# Patient Record
Sex: Female | Born: 1995
Health system: Southern US, Community
[De-identification: ages and names within clinical notes are randomized; demographics above are authoritative.]

## PROBLEM LIST (undated history)

## (undated) DIAGNOSIS — F32A Depression, unspecified: Secondary | ICD-10-CM

## (undated) DIAGNOSIS — F329 Major depressive disorder, single episode, unspecified: Secondary | ICD-10-CM

## (undated) DIAGNOSIS — J45909 Unspecified asthma, uncomplicated: Secondary | ICD-10-CM

## (undated) HISTORY — PX: TONSILLECTOMY AND ADENOIDECTOMY: SUR1326

## (undated) HISTORY — DX: Unspecified asthma, uncomplicated: J45.909

---
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
## 2014-01-20 ENCOUNTER — Encounter (HOSPITAL_COMMUNITY): Payer: Self-pay | Admitting: Psychiatry

## 2014-01-20 ENCOUNTER — Ambulatory Visit (INDEPENDENT_AMBULATORY_CARE_PROVIDER_SITE_OTHER): Payer: Private Health Insurance - Indemnity | Admitting: Psychiatry

## 2014-01-20 VITALS — BP 122/77 | HR 77 | Ht 66.0 in | Wt 112.4 lb

## 2014-01-20 DIAGNOSIS — F39 Unspecified mood [affective] disorder: Secondary | ICD-10-CM

## 2014-01-20 DIAGNOSIS — F1210 Cannabis abuse, uncomplicated: Secondary | ICD-10-CM

## 2014-01-20 DIAGNOSIS — F411 Generalized anxiety disorder: Secondary | ICD-10-CM

## 2014-01-20 MED ORDER — BUSPIRONE HCL 10 MG PO TABS: 10.0000 mg | ORAL_TABLET | Freq: Three times a day (TID) | ORAL | Status: DC

## 2014-01-20 MED ORDER — SERTRALINE HCL 100 MG PO TABS: 150.0000 mg | ORAL_TABLET | Freq: Every day | ORAL | Status: DC

## 2014-01-20 MED ORDER — ARIPIPRAZOLE 5 MG PO TABS: 5.0000 mg | ORAL_TABLET | Freq: Two times a day (BID) | ORAL | Status: DC

## 2014-01-20 NOTE — Progress Notes (Addendum)
   [facility]  Paula Whitaker 02/02/1996  Date:  01/20/14 Subjective: Pt is here for follow up Sleeping is fair; appetite is fair. Mood is anxious, with panic attacks. She doesn't like the hydroxyzine, has tried 25 mg, and 50 mg and she says it doesn't help. Depression-7/10, Anxiety 8/10. She also says when she has anxiety, she cracks her knuckles a certain way. Tolerating the other medications. She is on sertraline 100 mg po, and abilify 10 mg daily, and has some drowsiness with the abilify, will split up the dose. Rtc in 4 weeks.   There were no vitals filed for this visit.  Mental Status Examination  Appearance: casual  Alert: Yes Attention: fair  Cooperative: Yes Eye Contact: Fair Speech: wdl  Psychomotor Activity: Normal Memory/Concentration: fair  Oriented: time/date, situation and day of week Mood: Anxious and Dysphoric Affect: Constricted Thought Processes and Associations: Linear and Logical Fund of Knowledge: Fair Thought Content: preoccupations Insight: Fair Judgement: Fair  Diagnosis:  Episodic Mood Cannabis Abuse GAD Treatment Plan:  Abilify 5 mg, 2 times daily for mood  Sertraline 150 mg po daily for depression Buspar 10 mg tid for anxiety  Kendrick FriesBLANKMANN, Paula Wareing, NP

## 2014-02-23 ENCOUNTER — Encounter (HOSPITAL_COMMUNITY): Payer: Self-pay | Admitting: Psychiatry

## 2014-02-23 ENCOUNTER — Ambulatory Visit (INDEPENDENT_AMBULATORY_CARE_PROVIDER_SITE_OTHER): Payer: Private Health Insurance - Indemnity | Admitting: Psychiatry

## 2014-02-23 VITALS — BP 137/72 | HR 89 | Ht 66.0 in | Wt 115.4 lb

## 2014-02-23 DIAGNOSIS — F411 Generalized anxiety disorder: Secondary | ICD-10-CM

## 2014-02-23 DIAGNOSIS — F39 Unspecified mood [affective] disorder: Secondary | ICD-10-CM

## 2014-02-23 MED ORDER — PROPRANOLOL HCL 10 MG PO TABS: 10.0000 mg | ORAL_TABLET | Freq: Two times a day (BID) | ORAL | Status: DC | PRN

## 2014-02-23 MED ORDER — BUSPIRONE HCL 15 MG PO TABS: 15.0000 mg | ORAL_TABLET | Freq: Three times a day (TID) | ORAL | Status: DC

## 2014-02-23 MED ORDER — ARIPIPRAZOLE 5 MG PO TABS: 5.0000 mg | ORAL_TABLET | Freq: Two times a day (BID) | ORAL | Status: DC

## 2014-02-23 MED ORDER — SERTRALINE HCL 100 MG PO TABS: 200.0000 mg | ORAL_TABLET | Freq: Every day | ORAL | Status: DC

## 2014-02-23 NOTE — Progress Notes (Addendum)
   [facility]  Paula Whitaker 06/05/1996  Date:  02/23/14 Subjective:  Pt is working, and will start school up again. sleeping and eating normally. Anxiety 7/10, Depression 4/10. Tolerating her medications. Constricted affect, and mildly irritable.Well groomed. She reports she is doing better, but still having intermittent anxiety. She denies SI/HI/AVH. She hasn't been going to therapy, because of her rigorous work schedule. Discussed alternatives to coping with stress and anxiety. Rtc in 4 weeks.   Filed Vitals:   02/23/14 1629  BP: 137/72  Pulse: 89    Mental Status Examination  Appearance: casual  Alert: Yes Attention: fair  Cooperative: Yes Eye Contact: Fair Speech: wdl  Psychomotor Activity: Normal Memory/Concentration: fair  Oriented: place and day of week Mood: Anxious Affect: Constricted Thought Processes and Associations: Linear Fund of Knowledge: Fair Thought Content: preoccupations Insight: Fair Judgement: Fair  Diagnosis:  GAD Episodic Mood Treatment Plan:  Rtc in 4 weeks Abilify 5 mg po daily for mood Sertraline 200 mg daily for anxiety/depression Buspar 15 mg tid for anxiety Propranolol 10 mg bid prn for situational anxiety   Kendrick FriesBLANKMANN, [place]ise Wahler, NP

## 2014-03-30 ENCOUNTER — Ambulatory Visit (HOSPITAL_COMMUNITY): Payer: Self-pay | Admitting: Psychiatry

## 2014-07-08 ENCOUNTER — Emergency Department (HOSPITAL_COMMUNITY)
Admission: EM | Admit: 2014-07-08 | Discharge: 2014-07-09 | Disposition: A | Discharge status: Other Acute Inpatient Hospital | Payer: Private Health Insurance - Indemnity | Attending: Emergency Medicine | Admitting: Emergency Medicine

## 2014-07-08 DIAGNOSIS — F1310 Sedative, hypnotic or anxiolytic abuse, uncomplicated: Secondary | ICD-10-CM | POA: Insufficient documentation

## 2014-07-08 DIAGNOSIS — Z3202 Encounter for pregnancy test, result negative: Secondary | ICD-10-CM | POA: Diagnosis not present

## 2014-07-08 DIAGNOSIS — Z79899 Other long term (current) drug therapy: Secondary | ICD-10-CM | POA: Diagnosis not present

## 2014-07-08 DIAGNOSIS — F329 Major depressive disorder, single episode, unspecified: Secondary | ICD-10-CM | POA: Diagnosis not present

## 2014-07-08 DIAGNOSIS — F39 Unspecified mood [affective] disorder: Secondary | ICD-10-CM | POA: Diagnosis not present

## 2014-07-08 DIAGNOSIS — R45851 Suicidal ideations: Secondary | ICD-10-CM

## 2014-07-08 DIAGNOSIS — F1410 Cocaine abuse, uncomplicated: Secondary | ICD-10-CM | POA: Insufficient documentation

## 2014-07-08 DIAGNOSIS — Z046 Encounter for general psychiatric examination, requested by authority: Secondary | ICD-10-CM | POA: Diagnosis present

## 2014-07-08 DIAGNOSIS — F1210 Cannabis abuse, uncomplicated: Secondary | ICD-10-CM

## 2014-07-08 DIAGNOSIS — J45909 Unspecified asthma, uncomplicated: Secondary | ICD-10-CM | POA: Diagnosis not present

## 2014-07-08 DIAGNOSIS — F411 Generalized anxiety disorder: Secondary | ICD-10-CM | POA: Insufficient documentation

## 2014-07-08 NOTE — ED Provider Notes (Signed)
CSN: [id number]     Arrival date & time 07/08/14  2332 History  This chart was scribed for Elpidio AnisShari Renleigh Ouellet, PA-C with Derwood KaplanAnkit Nanavati, MD by Tonye RoyaltyJoshua Chen, ED Scribe. This patient was seen in room WTR4/WLPT4 and the patient's care was started at 11:43 PM.    No chief complaint on file.  The history is provided by the patient. No language interpreter was used.    HPI Comments: Paula Whitaker is a 18 y.o. female who presents to the Emergency Department complaining of suicidal ideation with onset 1 week ago. She reports associated decreased appetite. She states she does not use anxiety or depression medications normally. She denies smoking.  She denies vomiting, fever, abdominal pain, or chest pain.  Past Medical History  Diagnosis Date  . Asthma   . Depression    Past Surgical History  Procedure Laterality Date  . Tonsillectomy and adenoidectomy     Family History  Problem Relation Age of Onset  . Schizophrenia      Patenral Grandmother   History  Substance Use Topics  . Smoking status: Never Smoker   . Smokeless tobacco: Never Used  . Alcohol Use: No   OB History    No data available     Review of Systems  Constitutional: Negative for fever.  Cardiovascular: Negative for chest pain.  Gastrointestinal: Negative for vomiting and abdominal pain.  Psychiatric/Behavioral: Positive for suicidal ideas.      Allergies  Review of patient's allergies indicates no known allergies.  Home Medications   Prior to Admission medications   Medication Sig Start Date End Date Taking? Authorizing Provider  albuterol (PROAIR HFA) 108 (90 BASE) MCG/ACT inhaler 2 puffs every 6 (six) hours as needed for wheezing or shortness of breath. 09/15/13   Chauncey MannGlenn E Jennings, MD  ARIPiprazole (ABILIFY) 5 MG tablet Take 1 tablet (5 mg total) by mouth 2 (two) times daily. 02/23/14 02/23/15  Kendrick FriesMeghan Blankmann, NP  aspirin-acetaminophen-caffeine (EXCEDRIN MIGRAINE) [phone] MG per tablet Take 1 tablet by mouth  every 6 (six) hours as needed for headache or migraine. 09/15/13   Chauncey MannGlenn E Jennings, MD  busPIRone (BUSPAR) 15 MG tablet Take 1 tablet (15 mg total) by mouth 3 (three) times daily. 02/23/14   Kendrick FriesMeghan Blankmann, NP  fluticasone (FLONASE) 50 MCG/ACT nasal spray Place 1 spray into the nose daily as needed for allergies.  11/18/13   Historical Provider, MD  hydroxypropyl methylcellulose (ISOPTO TEARS) 2.5 % ophthalmic solution Place 1 drop into both eyes 3 (three) times daily as needed for dry eyes.    Historical Provider, MD  Multiple Vitamin (MULTIVITAMIN WITH MINERALS) TABS tablet Take 1 tablet by mouth daily. 09/15/13   Chauncey MannGlenn E Jennings, MD  Norethin Ace-Eth Estrad-FE (MINASTRIN 24 FE) 1-20 MG-MCG(24) CHEW Chew 1 tablet by mouth daily. 09/15/13   Chauncey MannGlenn E Jennings, MD  propranolol (INDERAL) 10 MG tablet Take 1 tablet (10 mg total) by mouth 2 (two) times daily as needed (anxiety). 02/23/14   Kendrick FriesMeghan Blankmann, NP  sertraline (ZOLOFT) 100 MG tablet Take 2 tablets (200 mg total) by mouth daily. 02/23/14   Kendrick FriesMeghan Blankmann, NP   There were no vitals taken for this visit. Physical Exam  Constitutional: She is oriented to person, place, and time. She appears well-developed and well-nourished.  HENT:  Head: Normocephalic.  Neck: Normal range of motion. Neck supple.  Cardiovascular: Normal rate and regular rhythm.   Pulmonary/Chest: Effort normal and breath sounds normal.  Abdominal: Soft. Bowel sounds are normal. There is no  tenderness. There is no rebound and no guarding.  Musculoskeletal: Normal range of motion.  Neurological: She is alert and oriented to person, place, and time.  Skin: Skin is warm and dry. No rash noted.  Psychiatric: Her speech is normal. She exhibits a depressed mood.  Patient is tearful, sobbing. She does not endorse or deny SI.    ED Course  Procedures (including critical care time)   COORDINATION OF CARE: 11:45 PM Discussed treatment plan with patient at beside, the patient agrees  with the plan and has no further questions at this time.   Labs Review Labs Reviewed - No data to display  Imaging Review No results found.   EKG Interpretation None      MDM   Final diagnoses:  None    1. Depression  TTS consult required to determine disposition. Patient agreeable. Medically cleared.  I personally performed the services described in this documentation, which was scribed in my presence. The recorded information has been reviewed and is accurate.     Arnoldo HookerShari A Christi Wirick, PA-C 07/10/14 [id number]  Derwood KaplanAnkit Nanavati, MD 07/10/14 [phone]

## 2014-07-09 ENCOUNTER — Inpatient Hospital Stay (HOSPITAL_COMMUNITY)
Admission: AD | Admit: 2014-07-09 | Discharge: 2014-07-12 | DRG: 881 | Disposition: A | Discharge status: Home / Self Care | Payer: Private Health Insurance - Indemnity | Source: Intra-hospital | Attending: Psychiatry | Admitting: Psychiatry

## 2014-07-09 ENCOUNTER — Encounter (HOSPITAL_COMMUNITY): Payer: Self-pay | Admitting: *Deleted

## 2014-07-09 DIAGNOSIS — J45909 Unspecified asthma, uncomplicated: Secondary | ICD-10-CM | POA: Diagnosis present

## 2014-07-09 DIAGNOSIS — F329 Major depressive disorder, single episode, unspecified: Secondary | ICD-10-CM | POA: Diagnosis present

## 2014-07-09 DIAGNOSIS — F39 Unspecified mood [affective] disorder: Secondary | ICD-10-CM | POA: Diagnosis present

## 2014-07-09 DIAGNOSIS — Z818 Family history of other mental and behavioral disorders: Secondary | ICD-10-CM | POA: Diagnosis not present

## 2014-07-09 DIAGNOSIS — F1410 Cocaine abuse, uncomplicated: Secondary | ICD-10-CM | POA: Diagnosis present

## 2014-07-09 DIAGNOSIS — G4700 Insomnia, unspecified: Secondary | ICD-10-CM | POA: Diagnosis present

## 2014-07-09 DIAGNOSIS — F321 Major depressive disorder, single episode, moderate: Secondary | ICD-10-CM | POA: Insufficient documentation

## 2014-07-09 DIAGNOSIS — Z23 Encounter for immunization: Secondary | ICD-10-CM | POA: Diagnosis not present

## 2014-07-09 DIAGNOSIS — F410 Panic disorder [episodic paroxysmal anxiety] without agoraphobia: Secondary | ICD-10-CM | POA: Diagnosis present

## 2014-07-09 DIAGNOSIS — F1210 Cannabis abuse, uncomplicated: Secondary | ICD-10-CM | POA: Diagnosis present

## 2014-07-09 DIAGNOSIS — F1919 Other psychoactive substance abuse with unspecified psychoactive substance-induced disorder: Secondary | ICD-10-CM

## 2014-07-09 DIAGNOSIS — F411 Generalized anxiety disorder: Secondary | ICD-10-CM | POA: Diagnosis present

## 2014-07-09 DIAGNOSIS — R45851 Suicidal ideations: Secondary | ICD-10-CM

## 2014-07-09 LAB — COMPREHENSIVE METABOLIC PANEL
ALT: 11 U/L (ref 0–35)
AST: 19 U/L (ref 0–37)
Albumin: 4.2 g/dL (ref 3.5–5.2)
Alkaline Phosphatase: 80 U/L (ref 39–117)
Anion gap: 13 (ref 5–15)
BILIRUBIN TOTAL: 1.3 mg/dL — AB (ref 0.3–1.2)
BUN: 10 mg/dL (ref 6–23)
CALCIUM: 10.4 mg/dL (ref 8.4–10.5)
CO2: 27 meq/L (ref 19–32)
CREATININE: 0.79 mg/dL (ref 0.50–1.10)
Chloride: 99 mEq/L (ref 96–112)
GFR calc Af Amer: >90 mL/min (ref >90)
GLUCOSE: 94 mg/dL (ref 70–99)
Potassium: 3.6 mEq/L — ABNORMAL LOW (ref 3.7–5.3)
Sodium: 139 mEq/L (ref 137–147)
Total Protein: 7.7 g/dL (ref 6.0–8.3)

## 2014-07-09 LAB — CBC WITH DIFFERENTIAL/PLATELET
BASOS ABS: 0.1 10*3/uL (ref 0.0–0.1)
Basophils Relative: 1 % (ref 0–1)
EOS ABS: 1 10*3/uL — AB (ref 0.0–0.7)
Eosinophils Relative: 9 % — ABNORMAL HIGH (ref 0–5)
HCT: 43.3 % (ref 36.0–46.0)
Hemoglobin: 14.9 g/dL (ref 12.0–15.0)
Lymphocytes Relative: 25 % (ref 12–46)
Lymphs Abs: 2.8 10*3/uL (ref 0.7–4.0)
MCH: 30.2 pg (ref 26.0–34.0)
MCHC: 34.4 g/dL (ref 30.0–36.0)
MCV: 87.8 fL (ref 78.0–100.0)
Monocytes Absolute: 1 10*3/uL (ref 0.1–1.0)
Monocytes Relative: 9 % (ref 3–12)
NEUTROS ABS: 6.5 10*3/uL (ref 1.7–7.7)
NEUTROS PCT: 56 % (ref 43–77)
Platelets: 307 10*3/uL (ref 150–400)
RBC: 4.93 MIL/uL (ref 3.87–5.11)
RDW: 12.8 % (ref 11.5–15.5)
WBC: 11.4 10*3/uL — ABNORMAL HIGH (ref 4.0–10.5)

## 2014-07-09 LAB — SALICYLATE LEVEL

## 2014-07-09 LAB — RAPID URINE DRUG SCREEN, HOSP PERFORMED
Amphetamines: NOT DETECTED
Barbiturates: NOT DETECTED
Benzodiazepines: POSITIVE — AB
Cocaine: POSITIVE — AB
Opiates: NOT DETECTED
Tetrahydrocannabinol: POSITIVE — AB

## 2014-07-09 LAB — PREGNANCY, URINE: PREG TEST UR: NEGATIVE

## 2014-07-09 LAB — ETHANOL

## 2014-07-09 LAB — ACETAMINOPHEN LEVEL: Acetaminophen (Tylenol), Serum: <15 ug/mL (ref 10–30)

## 2014-07-09 MED ORDER — MAGNESIUM HYDROXIDE 400 MG/5ML PO SUSP: 30.0000 mL | Freq: Every day | ORAL | Status: DC | PRN

## 2014-07-09 MED ORDER — INFLUENZA VAC SPLIT QUAD 0.5 ML IM SUSY
0.5000 mL | PREFILLED_SYRINGE | INTRAMUSCULAR | Status: AC
  Administered 2014-07-10: 0.5 mL via INTRAMUSCULAR
  Filled 2014-07-09: qty 0.5

## 2014-07-09 MED ORDER — TRAZODONE HCL 50 MG PO TABS
50.0000 mg | ORAL_TABLET | Freq: Every evening | ORAL | Status: DC | PRN
  Administered 2014-07-09 – 2014-07-11 (×3): 50 mg via ORAL
  Filled 2014-07-09 (×2): qty 1
  Filled 2014-07-09: qty 10
  Filled 2014-07-09: qty 1

## 2014-07-09 MED ORDER — BUSPIRONE HCL 15 MG PO TABS
15.0000 mg | ORAL_TABLET | Freq: Two times a day (BID) | ORAL | Status: DC
  Filled 2014-07-09 (×5): qty 1

## 2014-07-09 MED ORDER — LORAZEPAM 1 MG PO TABS: 1.0000 mg | ORAL_TABLET | Freq: Three times a day (TID) | ORAL | Status: DC | PRN

## 2014-07-09 MED ORDER — PROPRANOLOL HCL 10 MG PO TABS
10.0000 mg | ORAL_TABLET | Freq: Two times a day (BID) | ORAL | Status: DC | PRN
  Filled 2014-07-09: qty 20

## 2014-07-09 MED ORDER — HYDROXYZINE HCL 25 MG PO TABS
25.0000 mg | ORAL_TABLET | Freq: Four times a day (QID) | ORAL | Status: DC | PRN
  Administered 2014-07-09 – 2014-07-11 (×3): 25 mg via ORAL
  Filled 2014-07-09 (×2): qty 1
  Filled 2014-07-09: qty 20
  Filled 2014-07-09 (×2): qty 1

## 2014-07-09 MED ORDER — ONDANSETRON HCL 4 MG PO TABS: 4.0000 mg | ORAL_TABLET | Freq: Three times a day (TID) | ORAL | Status: DC | PRN

## 2014-07-09 MED ORDER — ACETAMINOPHEN 325 MG PO TABS: 650.0000 mg | ORAL_TABLET | ORAL | Status: DC | PRN

## 2014-07-09 MED ORDER — ALBUTEROL SULFATE HFA 108 (90 BASE) MCG/ACT IN AERS
2.0000 | INHALATION_SPRAY | Freq: Four times a day (QID) | RESPIRATORY_TRACT | Status: DC | PRN
  Administered 2014-07-09: 2 via RESPIRATORY_TRACT
  Filled 2014-07-09: qty 6.7

## 2014-07-09 MED ORDER — ALUM & MAG HYDROXIDE-SIMETH 200-200-20 MG/5ML PO SUSP: 30.0000 mL | ORAL | Status: DC | PRN

## 2014-07-09 MED ORDER — ADULT MULTIVITAMIN W/MINERALS CH
1.0000 | ORAL_TABLET | Freq: Every day | ORAL | Status: DC
  Administered 2014-07-11: 1 via ORAL
  Filled 2014-07-09 (×4): qty 1

## 2014-07-09 MED ORDER — ALBUTEROL SULFATE HFA 108 (90 BASE) MCG/ACT IN AERS
2.0000 | INHALATION_SPRAY | RESPIRATORY_TRACT | Status: DC | PRN
  Administered 2014-07-09: 2 via RESPIRATORY_TRACT
  Filled 2014-07-09: qty 6.7

## 2014-07-09 MED ORDER — ACETAMINOPHEN 325 MG PO TABS
650.0000 mg | ORAL_TABLET | Freq: Four times a day (QID) | ORAL | Status: DC | PRN
  Administered 2014-07-11: 650 mg via ORAL
  Filled 2014-07-09: qty 2

## 2014-07-09 NOTE — BH Assessment (Signed)
Received a callback from Leodis Rainsrew Holst, Counselor with [facility].  He staffed the case with the counselor on call who spoke with the pt today.  He reported that pt was "volitile and hostile, but not aggressive....clearly depressed and very tearful"  He stated that given her history of self harm and impulsivity, they felt she needed to be IVC'd.   Beryle FlockMary Daphine Loch, MS, CRC, [facility] Triage Specialist [facility]

## 2014-07-09 NOTE — ED Notes (Signed)
Pt brought in by [employer] with IVC papers.  Pt reports she called the hotline for help and she ended up here.  Pt reports she told the counselor on the phone that she was having a dream about her jumping off a roof, denies SI.  Pt is teary but cooperative.  Pt reports pain in her tailbone but denies injury at this time.  Pt reports having an argument with her parents tonight after getting home from hanging out with friends at 0400.  States her parents told her before that she did not have a curfew anymore.

## 2014-07-09 NOTE — ED Notes (Signed)
[employer] contacted for transport 

## 2014-07-09 NOTE — ED Notes (Signed)
Pt. Talking to TTS via Telepsych camera.

## 2014-07-09 NOTE — ED Notes (Signed)
Pt. To [facility] from ED ambulatory without difficulty, to room 35 . Report from Autumn RN. Pt. Is alert and oriented, warm and dry, tearfull. Pt. Denies SI, HI, and AVH. Pt. Pt. Made aware of security cameras and Q15 minute rounds. Pt. Encouraged to let Nursing staff know of any concerns or needs.

## 2014-07-09 NOTE — Progress Notes (Signed)
Paula Whitaker is a young 18 yr old caucasian female who [facility] after being [facility]C'd by her other. She is sobbing uncontrollably during the interview. She chooses not to make eye contact. She says she has no medical problems and denies active suicidality...Marland Kitchen.as she is giving this Engineer, manufacturingwriter info, she shares that she has been in this hospital before and that she " has always " had discord between she and her parents. She denies regular medicaiton. States " I just want to get the lell out f here". After assessment complete, pt is oriented to unit.

## 2014-07-09 NOTE — Progress Notes (Signed)
Adult Psychoeducational Group Note  Date:  07/09/2014 Time:  6:20 PM  Group Topic/Focus: Music Activity   Participation Level:  Active  Participation Quality:  Appropriate  Affect:  Appropriate  Cognitive:  Appropriate  Insight: Appropriate  Engagement in Group:  Engaged  Modes of Intervention:  Activity  Additional Comments:  

## 2014-07-09 NOTE — ED Notes (Addendum)
Pt c/o wheezing and is requesting her inhaler.  Exp wheezes noted bilat RT>LT.  Will notify EDP

## 2014-07-09 NOTE — ED Notes (Signed)
Dr saranga and jamison np into see 

## 2014-07-09 NOTE — ED Notes (Signed)
[facility], belongings sent w/ pt.

## 2014-07-09 NOTE — Tx Team (Signed)
Initial Interdisciplinary Treatment Plan   PATIENT STRESSORS: Educational concerns Financial difficulties Legal issue Marital or family conflict Occupational concerns   PATIENT STRENGTHS: Ability for insight Active sense of humor Average or above average intelligence Capable of independent living MetallurgistCommunication skills Financial means General fund of knowledge   PROBLEM LIST: Problem List/Patient Goals Date to be addressed Date deferred Reason deferred Estimated date of resolution  Depression 07/09/14     Suicidal Ideation 07/09/14     Asthma 07/09/14                                          DISCHARGE CRITERIA:  Ability to meet basic life and health needs Adequate post-discharge living arrangements Improved stabilization in mood, thinking, and/or behavior Medical problems require only outpatient monitoring Motivation to continue treatment in a less acute level of care Need for constant or close observation no longer present  PRELIMINARY DISCHARGE PLAN: Participate in family therapy Placement in alternative living arrangements Return to previous living arrangement Return to previous work or school arrangements  PATIENT/FAMIILY INVOLVEMENT: This treatment plan has been presented to and reviewed with the patient, Paula Whitaker, and/or family member, .  The patient and family have been given the opportunity to ask questions and make suggestions.  Rich BraveDuke, Pesach Frisch Lynn 07/09/2014, 6:44 PM
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
## 2014-07-09 NOTE — BH Assessment (Signed)
Assessment requested & assigned.  Spoke to Genuine PartsShari Upstill, PA-C:  She advised pt is medically cleared.  Says pt has been IVC'd by mother due to SI and Depression.  Spoke to Marita SnellenGary Leduc, RN: Asked to put TA equipment in pt's room.   Beryle FlockMary Verbena Boeding, MS, CRC, [facility] Triage Specialist [facility]
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
## 2014-07-09 NOTE — ED Notes (Signed)
Pt is aware that she is going to be admitted, crying, requesting to "sign herself out".  Pt is aware that she is under IVC and can not sign herself out, requesting to speak to a MD.  Dr Tawni Carnessaranga is aware.

## 2014-07-09 NOTE — ED Notes (Signed)
Pt on the phone crying, talking to parents per pt.  Pt able to calm down some, but is still upset about being admitted. Support given. Pt declined medication for anxiety.

## 2014-07-09 NOTE — BH Assessment (Addendum)
Assessment completed.  Spoke to PepsiCoeil Mashburn, [facility] PA-C: She advised that she does not feel that pt is appropriate for discharge at this time. She advised that since pt is denying any SI, HI, SH impulses or AVH but mother/mobile crisis felt the need to IVC her we need more information.  Also, all labs are not in yet.  Spoke to Marita SnellenGary Leduc, [facility] RN:  He advised there is no report from [facility] in her chart but, there is some information on the IVC paperwork regarding why she was IVC'd. He will fax it over.  Called [facility] and spoke to [place].  She says she is not the clinician who saw the pt today but will have that clinician call us to give us more information regarding the IVC.   Spoke to Elpidio AnisShari Upstill, ED PA-C: Advised her of the assessment outcome and plan to gain more information and re-assess once that information is obtained.    Spoke with Marita SnellenGary Leduc, RN:  Advised him of the assessment outcome and plan.   Beryle FlockMary Naveen Lorusso, MS, CRC, [facility] Triage Specialist  [facility]

## 2014-07-10 ENCOUNTER — Encounter (HOSPITAL_COMMUNITY): Payer: Self-pay | Admitting: Psychiatry

## 2014-07-10 DIAGNOSIS — F1490 Cocaine use, unspecified, uncomplicated: Secondary | ICD-10-CM

## 2014-07-10 DIAGNOSIS — F39 Unspecified mood [affective] disorder: Secondary | ICD-10-CM

## 2014-07-10 DIAGNOSIS — F1290 Cannabis use, unspecified, uncomplicated: Secondary | ICD-10-CM

## 2014-07-10 DIAGNOSIS — F1099 Alcohol use, unspecified with unspecified alcohol-induced disorder: Secondary | ICD-10-CM

## 2014-07-10 DIAGNOSIS — F331 Major depressive disorder, recurrent, moderate: Secondary | ICD-10-CM

## 2014-07-10 MED ORDER — LIDOCAINE 5 % EX PTCH
1.0000 | MEDICATED_PATCH | CUTANEOUS | Status: DC
  Administered 2014-07-10 – 2014-07-11 (×2): 1 via TRANSDERMAL
  Filled 2014-07-10 (×5): qty 1

## 2014-07-10 MED ORDER — IBUPROFEN 600 MG PO TABS
600.0000 mg | ORAL_TABLET | Freq: Four times a day (QID) | ORAL | Status: DC | PRN
  Administered 2014-07-10 – 2014-07-12 (×3): 600 mg via ORAL
  Filled 2014-07-10 (×3): qty 1

## 2014-07-10 NOTE — Progress Notes (Signed)
D) Pt has been up and out of bed. Had a conversation with her father which resulted in Pt yelling and cursing at her father over the phone. Pt was able to sit and state she was so upset "He said I couldn't go home. He said I was not welcomed in the house. I have no place to go. No place to live. I don't even have a car". Pt has an entitled attitude, that her family should allow her to use the car and to do whatever she would like to do. Lacks insight into beginning to be a responsible person for her actions. A) Talked with Pt's father on the phone who said he wanted to come and visit Pt but was not sure if he should. Pt was approached with this information later in the day and given the choice to call her father at home and ask him if he would come and visit her. Talked with Pt about the pros and cons of her actions and decisions that she is making now. R) Pt. Given space and time to cool off and given the choice to call her father.
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
## 2014-07-10 NOTE — [facility] Group Notes (Signed)
[facility] Group Notes:  (Nursing/MHT/Case Management/Adjunct)  Date:  07/10/2014  Time:  1515pm  Type of Therapy:  Therapeutic Ball Activity  Participation Level:  Active  Participation Quality:  Appropriate and Attentive  Affect:  Appropriate  Cognitive:  Alert and Appropriate  Insight:  Appropriate and Good  Engagement in Group:  Engaged  Modes of Intervention:  Activity  Summary of Progress/Problems: Patient attended group and participated in activity and answered activity questions appropriately.  Paula Whitaker, Paula Whitaker A 07/10/2014, 4:11 PM
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
## 2014-07-10 NOTE — [facility] Group Notes (Signed)
[facility] LCSW Group Therapy  07/10/2014 2:03 PM  Type of Therapy:  Group Therapy  Participation Level:  Did Not Attend  Participation Quality:  N/A  Affect:  N/A  Cognitive:  N/A  Insight:  N/A  Engagement in Therapy:  N/A  Modes of Intervention:  Discussion, Education, Exploration, Rapport Building and Support  Summary of Progress/Problems: Did not attend  Paula Whitaker, Paula Whitaker 07/10/2014, 2:03 PM

## 2014-07-10 NOTE — [facility] Group Notes (Signed)
[facility] Group Notes:  (Nursing/MHT/Case Management/Adjunct)  Date:  07/10/2014  Time:  1030am  Type of Therapy:  Life Skills Group  Participation Level:  Active  Participation Quality:  Appropriate and Attentive  Affect:  Appropriate  Cognitive:  Alert and Appropriate  Insight:  Appropriate and Good  Engagement in Group:  Engaged  Modes of Intervention:  Discussion, Education and Support  Summary of Progress/Problems: Patient was engaged during group and responded appropriately. Pt rates her energy level 7/10 and states "support" is what love means to her.  Lendell CapriceGuthrie, Djibril Glogowski A 07/10/2014, 2:23 PM

## 2014-07-11 DIAGNOSIS — F411 Generalized anxiety disorder: Secondary | ICD-10-CM

## 2014-07-11 NOTE — Progress Notes (Signed)
[facility]  07/11/2014 2:39 PM Darryl Lentmanda Niznik  MRN:  [id number] Subjective:  [name] states that she apologized to her parents but they do not want to hear it. States she cried a lot. She wants to be allowed to go back home until she can go back to [place]. She would also look at staying with some friends if the parents do not want her there at all. She states she does not use cocaine that often and she is willing to drop it altogether. She does not think she can do the same with marijuana as states it helps her a lot Diagnosis:   DSM5: Substance/Addictive Disorders:  Cannabis Use Disorder - Moderate 9304.30) Depressive Disorders:  Major Depressive Disorder - Moderate (296.22) Total Time spent with patient: 30 minutes  Axis I: Generalized Anxiety Disorder  ADL's:  Intact  Sleep: Fair  Appetite:  Fair Psychiatric Specialty Exam: Physical Exam  Review of Systems  Constitutional: Negative.   HENT: Negative.   Eyes: Negative.   Respiratory: Negative.   Cardiovascular: Negative.   Gastrointestinal: Negative.   Genitourinary: Negative.   Musculoskeletal: Positive for back pain.  Skin: Negative.   Neurological: Negative.   Endo/Heme/Allergies: Negative.   Psychiatric/Behavioral: Positive for substance abuse. The patient is nervous/anxious.     Blood pressure 118/78, pulse 75, temperature 97.7 F (36.5 C), temperature source Oral, resp. rate 16, height 5\' 5"  (1.651 m), weight 86.637 kg (191 lb), last menstrual period 06/25/2014, SpO2 98 %.Body mass index is 31.78 kg/(m^2).  General Appearance: Fairly Groomed  Patent attorneyye Contact::  Fair  Speech:  Clear and Coherent  Volume:  Normal  Mood:  Anxious and sad worried  Affect:  sad, tearful  Thought Process:  Coherent and Goal Directed  Orientation:  Full (Time, Place, and Person)  Thought Content:  relationship wiht her parents, what she would like to happen  Suicidal Thoughts:  No  Homicidal Thoughts:  No  Memory:   Immediate;   Fair Recent;   Fair Remote;   Fair  Judgement:  Fair  Insight:  Present  Psychomotor Activity:  Restlessness  Concentration:  Fair  Recall:  [employer]  Fund of Knowledge:Fair  Language: Fair  Akathisia:  No  Handed:    AIMS (if indicated):     Assets:  Desire for Improvement Talents/Skills Vocational/Educational  Sleep:  Number of Hours: 6.5   Musculoskeletal: Strength & Muscle Tone: within normal limits Gait & Station: normal Patient leans: N/A  Current Medications: Current Facility-Administered Medications  Medication Dose Route Frequency Provider Last Rate Last Dose  . acetaminophen (TYLENOL) tablet 650 mg  650 mg Oral Q6H PRN Fransisca KaufmannLaura Davis, NP   650 mg at 07/11/14 0645  . albuterol (PROVENTIL HFA;VENTOLIN HFA) 108 (90 BASE) MCG/ACT inhaler 2 puff  2 puff Inhalation Q6H PRN Fransisca KaufmannLaura Davis, NP   2 puff at 07/09/14 1813  . alum & mag hydroxide-simeth (MAALOX/MYLANTA) 200-200-20 MG/5ML suspension 30 mL  30 mL Oral Q4H PRN Fransisca KaufmannLaura Davis, NP      . hydrOXYzine (ATARAX/VISTARIL) tablet 25 mg  25 mg Oral Q6H PRN Fransisca KaufmannLaura Davis, NP   25 mg at 07/10/14 2045  . ibuprofen (ADVIL,MOTRIN) tablet 600 mg  600 mg Oral Q6H PRN Rachael FeeIrving A Krista Som, MD   600 mg at 07/11/14 1256  . lidocaine (LIDODERM) 5 % 1 patch  1 patch Transdermal Q24H Rachael FeeIrving A Iyania Denne, MD   1 patch at 07/10/14 1617  . magnesium hydroxide (MILK OF MAGNESIA) suspension 30 mL  30 mL  Oral Daily PRN Fransisca KaufmannLaura Davis, NP      . multivitamin with minerals tablet 1 tablet  1 tablet Oral Daily Fransisca KaufmannLaura Davis, NP   1 tablet at 07/11/14 [id number]  . propranolol (INDERAL) tablet 10 mg  10 mg Oral BID PRN Fransisca KaufmannLaura Davis, NP      . traZODone (DESYREL) tablet 50 mg  50 mg Oral QHS PRN Fransisca KaufmannLaura Davis, NP   50 mg at 07/10/14 2045    Lab Results: No results found for this or any previous visit (from the past 48 hour(s)).  Physical Findings: AIMS: Facial and Oral Movements Muscles of Facial Expression: None, normal Lips and Perioral Area: None, normal Jaw: None,  normal Tongue: None, normal,Extremity Movements Upper (arms, wrists, hands, fingers): None, normal Lower (legs, knees, ankles, toes): None, normal, Trunk Movements Neck, shoulders, hips: None, normal, Overall Severity Severity of abnormal movements (highest score from questions above): None, normal Incapacitation due to abnormal movements: None, normal Patient's awareness of abnormal movements (rate only patient's report): No Awareness, Dental Status Current problems with teeth and/or dentures?: No Does patient usually wear dentures?: No  CIWA:  CIWA-Ar Total: 5 COWS:     Treatment Plan Summary: Daily contact with patient to assess and evaluate symptoms and progress in treatment Medication management  Plan: Supportive approach/coping skill/relapse prevention           Anxiety/depression; Continue to work with CBT/DBT, mindfulness           Cannabis Cocaine Abuse; motivational interviewing, relapse prevention strategies           Facilitate a safe D/C home or where ever they decide she can go Medical Decision Making Problem Points:  Review of psycho-social stressors (1) Data Points:  Review of medication regiment & side effects (2)  I certify that inpatient services furnished can reasonably be expected to improve the patient's condition.   Chin Wachter A 07/11/2014, 2:39 PM

## 2014-07-11 NOTE — Progress Notes (Signed)
Patient did not attend the evening speaker AA meeting. Pt was notified that group was beginning but did not attend.   

## 2014-07-11 NOTE — [facility] Suicide Risk Assessment (Signed)
[facility] INPATIENT:  Family/Significant Other Suicide Prevention Education  Suicide Prevention Education:  Education Completed; Brayton Elshely Kooyman, Mother, [phone]; has been identified by the patient as the family member/significant other with whom the patient will be residing, and identified as the person(s) who will aid the patient in the event of a mental health crisis (suicidal ideations/suicide attempt).  With written consent from the patient, the family member/significant other has been provided the following suicide prevention education, prior to the and/or following the discharge of the patient.  The suicide prevention education provided includes the following:  Suicide risk factors  Suicide prevention and interventions  National Suicide Hotline telephone number  [facility] assessment telephone number  [facility] Emergency Assistance 911  [facility] and/or Residential Mobile Crisis Unit telephone number  Request made of family/significant other to:  Remove weapons (e.g., guns, rifles, knives), all items previously/currently identified as safety concern.   Patient advised patient does not have access to weapons.    Remove drugs/medications (over-the-counter, prescriptions, illicit drugs), all items previously/currently identified as a safety concern.  The family member/significant other verbalizes understanding of the suicide prevention education information provided.  The family member/significant other agrees to remove the items of safety concern listed above.  Wynn BankerHodnett, Macoy Rodwell Hairston 07/11/2014, 12:19 PM

## 2014-07-11 NOTE — Plan of Care (Signed)
Problem: Diagnosis: Increased Risk For Suicide Attempt Goal: STG-Patient Will Attend All Groups On The Unit Outcome: Progressing Patient is attending unit groups today. Goal: STG-Patient Will Comply With Medication Regime Outcome: Progressing Patient has adhered to with medication regimen today.   Problem: Ineffective individual coping Goal: STG: Patient will remain free from self harm Outcome: Progressing Patient remains free from self harm. 15 minute checks completed per protocol for patient safety.   Comments:  Alena BillsGuthrie, Fayez Sturgell A, RN

## 2014-07-11 NOTE — Progress Notes (Signed)
D: Patient in the hallway on approach.  Patient brighter on approach.  Patient states there is a possibility she will be discharged tomorrow.  Patient states her goal for today was to do better than she did yesterday.  Patient states her mother visited today and she states it was a good visit.  Patient states her father states she can come back home and they are going to work on things. Patient states she is going to work on being more respectful to her parents.  Patient   Patient denies SI/HI and denies AVH.   A: Staff to monitor Q 15 mins for safety.  Encouragement and support offered. No scheduled medications administered per orders.  Vistaril administered prn for anxiety.  Trazodone administered prn for sleep. R: Patient remains safe on the unit.  Patient attended group tonight.  Patient visible on the unit tonight.  Patient taking administered medications
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
## 2014-07-11 NOTE — Progress Notes (Signed)
D: Patient is alert and oriented. Pt's mood and affect is depressed, irritable, and blunted. Pt's eye contact is brief and pt responds minimally to RN today and appears gaurded. Pt reports she wants to go home. Pt rates her depression, hopelessness, and anxiety 0/10. Pt denies SI/HI and AVH. Pt reports back pain this morning 8/10 with no relief from PRN medications. Pt reports her goal for the day is "leaving and meet with parents." Pt writes on her self inventory sheet, "I feel like staff isn't helping me."  A: Cold and heat pack given to pt for back to see if either will help post PRN medication with no relief. PRN medications administered for pain per providers orders (See MAR).  Support/Encouragement provided to pt. Scheduled medications administered per providers orders (See MAR). 15 minute checks completed per protocol for patient safety.  R: Pt receptive to nursing interventions.
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
## 2014-07-11 NOTE — Progress Notes (Signed)
D: Writer met pt and Dad at the beginning of shift. Pt was tearful and upset that her parents are not welcoming her back into their home at this time. Pt was often heard blaming her parents for her behaviors. Pt is voicing that she is ready to change her behaviors. Pt did not specifically mentions those behaviors in front of this Probation officer. Pt's Dad does not believe that she is going to change. They are requesting a family session. Pt is currently negative for any SI/HI/AVH. Pt reports that she was never suicidal. " I only had dreams about falling off a bridge".  A: Writer administered scheduled and prn medications to pt, per MD orders. Continued support and availability as needed was extended to this pt. Staff continue to monitor pt with q57mn checks.  R: No adverse drug reactions noted. Pt receptive to treatment. Pt remains safe at this time.

## 2014-07-12 DIAGNOSIS — F1210 Cannabis abuse, uncomplicated: Secondary | ICD-10-CM

## 2014-07-12 DIAGNOSIS — F1410 Cocaine abuse, uncomplicated: Secondary | ICD-10-CM

## 2014-07-12 DIAGNOSIS — F329 Major depressive disorder, single episode, unspecified: Principal | ICD-10-CM

## 2014-07-12 DIAGNOSIS — F321 Major depressive disorder, single episode, moderate: Secondary | ICD-10-CM | POA: Insufficient documentation

## 2014-07-12 MED ORDER — TRAZODONE HCL 50 MG PO TABS: 50.0000 mg | ORAL_TABLET | Freq: Every evening | ORAL | Status: DC | PRN

## 2014-07-12 MED ORDER — ADULT MULTIVITAMIN W/MINERALS CH: 1.0000 | ORAL_TABLET | Freq: Every day | ORAL | Status: AC

## 2014-07-12 MED ORDER — ALBUTEROL SULFATE HFA 108 (90 BASE) MCG/ACT IN AERS: 2.0000 | INHALATION_SPRAY | Freq: Four times a day (QID) | RESPIRATORY_TRACT | Status: AC | PRN

## 2014-07-12 MED ORDER — HYDROXYZINE HCL 25 MG PO TABS: 25.0000 mg | ORAL_TABLET | Freq: Four times a day (QID) | ORAL | Status: DC | PRN

## 2014-07-12 MED ORDER — PROPRANOLOL HCL 10 MG PO TABS
10.0000 mg | ORAL_TABLET | Freq: Two times a day (BID) | ORAL | Status: DC | PRN
Start: 2014-07-12 — End: 2018-12-09

## 2014-07-12 MED ORDER — LIDOCAINE 5 % EX PTCH: 1.0000 | MEDICATED_PATCH | CUTANEOUS | Status: DC

## 2014-07-12 NOTE — Progress Notes (Signed)
Pt did not attend group this morning.  

## 2014-07-12 NOTE — Clinical Social Work Note (Signed)
CSW received a call from patient's mother wanting her to understand that she can return to the home but the house rules are non-negotiable.  She advised patient needs to understand, if she does not comply with the rules, she will be removed from the home. Patient and MD advised.  Patient stated she understands.  Patient to start partial hospitalization program on 08/13/13.

## 2014-07-12 NOTE — Progress Notes (Signed)
Patient ID: Paula Whitaker, female   DOB: 08/27/1995, 18 y.o.   MRN: [id number] Writer reviewed pt discharge instructions with pt including medications, follow up care and crisis intervention. Pt acknowledged understanding of instructions and states that She [facility] no reservations [facility]. Pt denies SI/HI and AVH. Pt mood and affect are appropriate to the situation. Writer returned pt belongings from locker, and the pt is released into her own care.

## 2014-07-12 NOTE — Progress Notes (Signed)
Pt attended spiritual care group on grief and loss facilitated by chaplain Burnis KingfisherMatthew Nayanna Seaborn and counseling intern [place] Austin. Group opened with brief discussion and psycho-social ed around grief and loss in relationships and in relation to self - identifying life patterns, circumstances, changes that cause losses. Established group norm of speaking from own life experience. Group goal of establishing open and affirming space for members to share loss and experience with grief, normalize grief experience and provide psycho social education and grief support. Group drew on narrative and Alderian therapeutic modalities.   [name] attended group and shared about losing her grandmother at [age]. She described her grandmother as a "role-model" and "protector," and would often go to her grandmother's house when things were difficult at home. [name] was comforted by another group member, and stepped out shortly after sharing.   [place]Jordan Austin Counseling Intern

## 2014-07-12 NOTE — [facility] Suicide Risk Assessment (Signed)
Suicide Risk Assessment  Discharge Assessment     Demographic Factors:  Adolescent or young adult and Caucasian  Total Time spent with patient: 30 minutes  Psychiatric Specialty Exam:     Blood pressure 119/73, pulse 89, temperature 98 F (36.7 C), temperature source Oral, resp. rate 17, height 5\' 5"  (1.651 m), weight 86.637 kg (191 lb), last menstrual period 06/25/2014, SpO2 98 %.Body mass index is 31.78 kg/(m^2).  General Appearance: Fairly Groomed  Patent attorneyye Contact::  Fair  Speech:  Clear and Coherent  Volume:  Normal  Mood:  Euthymic  Affect:  Appropriate  Thought Process:  Coherent and Goal Directed  Orientation:  Full (Time, Place, and Person)  Thought Content:  plans as she moves on  Suicidal Thoughts:  No  Homicidal Thoughts:  No  Memory:  Immediate;   Fair Recent;   Fair Remote;   Fair  Judgement:  Fair  Insight:  Present  Psychomotor Activity:  Normal  Concentration:  Fair  Recall:  FiservFair  Fund of Knowledge:NA  Language: Fair  Akathisia:  No  Handed:    AIMS (if indicated):     Assets:  Desire for Improvement Housing Talents/Skills  Sleep:  Number of Hours: 6.75    Musculoskeletal: Strength & Muscle Tone: within normal limits Gait & Station: normal Patient leans: N/A   Mental Status Per Nursing Assessment::   On Admission:  Self-harm thoughts  Current Mental Status by Physician: In full contact with reality. There are no active S/S of withdrawal. There are no active SI. There are no urges to cut. She is planning to back home. She is going to work on improving the relationship with her family. She understands that she will soon be going back to school. They already planning for her to have a place during the summer. [facility]    Loss Factors: NA  Historical Factors: NA  Risk Reduction Factors:   recent success in college, plans for the future  Continued Clinical Symptoms:  Depression:    Comorbid alcohol abuse/dependence  Cognitive Features That Contribute To Risk:  Closed-mindedness Polarized thinking Thought constriction (tunnel vision)    Suicide Risk:  Minimal: No identifiable suicidal ideation.  Patients presenting with no risk factors but with morbid ruminations; may be classified as minimal risk based on the severity of the depressive symptoms  Discharge Diagnoses:   AXIS I:  GAD, Unspecified Episodic Mood Disorder, Cannabis Dependence AXIS II:  No diagnosis AXIS III:   Past Medical History  Diagnosis Date  . Asthma   . Depression    AXIS IV:  other psychosocial or environmental problems AXIS V:  61-70 mild symptoms  Plan Of Care/Follow-up recommendations:  Activity:  as tolerated Diet:  regular [facility]/Still not wanting to take medications but willing to pursue individual/family therapy Is patient on multiple antipsychotic therapies at discharge:  No   Has Patient had three or more failed trials of antipsychotic monotherapy by history:  No  Recommended Plan for Multiple Antipsychotic Therapies: NA    Ronee Ranganathan A 07/12/2014, 11:25 AM

## 2014-07-12 NOTE — Tx Team (Signed)
Interdisciplinary Treatment Plan Update   Date Reviewed:  07/12/2014  Time Reviewed:  9:17 AM  Progress in Treatment:   Attending groups: Yes Participating in groups: Yes, patient engages in group discussion Taking medication as prescribed: Yes  Tolerating medication: Yes Family/Significant other contact made:  Yes, collateral contact with mother. Patient understands diagnosis: Yes, patient understands diagnosis and need for treatment. Discussing patient identified problems/goals with staff: Yes, patient is able to express goals for treatment and discharge. Medical problems stabilized or resolved: Yes Denies suicidal/homicidal ideation: Yes Patient has not harmed self or others: Yes  For review of initial/current patient goals, please see plan of care.  Estimated Length of Stay:  Discharge today  Reasons for Continued Hospitalization:   New Problems/Goals identified:    Discharge Plan or Barriers:   Home [facility]  Additional Comments:     Patient and CSW reviewed patient's identified goals and treatment plan.  Patient verbalized understanding and agreed to treatment plan.   Attendees:  Patient:  07/12/2014 9:17 AM   Signature:  Sallyanne HaversF. Cobos, MD 07/12/2014 9:17 AM  Signature: Geoffery LyonsIrving Lugo, MD 07/12/2014 9:17 AM  Signature:  Harold Barbanonecia Byrd, RN 07/12/2014 9:17 AM  Signature Liborio NixonPatrice White, RN 07/12/2014 9:17 AM  Signature:   07/12/2014 9:17 AM  Signature:  Juline PatchQuylle Aricka Goldberger, LCSW 07/12/2014 9:17 AM  Signature:  Belenda CruiseKristin Drinkard, LCSW-A 07/12/2014 9:17 AM  Signature:  Leisa LenzValerie Enoch, Care Coordinator [facility] 07/12/2014 9:17 AM  Signature:  07/12/2014 9:17 AM  Signature: 07/12/2014  9:17 AM  Signature:   Onnie BoerJennifer Clark, RN [facility] 07/12/2014  9:17 AM  Signature:  07/12/2014  9:17 AM    Scribe for Treatment Team:   Juline PatchQuylle Ryli Standlee,  07/12/2014 9:17 AM

## 2014-07-12 NOTE — Progress Notes (Signed)
[facility] :  Will you be returning to the same living situation after discharge: Yes,  Patient is returning home with parents. At discharge, do you have transportation home?:Yes,  Mother to transport patient home. Do you have the ability to pay for your medications:Yes,  Patient is able to obtain medications.  Release of information consent forms completed and in the chart;  Patient's signature needed at discharge.  Patient to Follow up at: Follow-up Information    [facility] with [facility] On 07/[facility]2014.   Why:  You are scheduled with Tomma LightningFrankie for counseling on Thursday, July 28, 2013 at 11AM.  Please arrive at 10:30 and bring completed registraton form   Contact information:   [address] [place], [place]   [id number]  [phone]      Follow [facility]feen [facility] On 08/[facility]/2014.   Why:  Tuesday, August 16, 2013 at 1:15 PM.  You will be placed on a cancellation list for an earlier appointment   Contact information:   [address] [place], [place]    [id number]  [phone]      [facility] On 07[facility]/2014.   Why:  Wednesday, July 13, 2014 at [facility]10AM   Contact information:   [address] [place], [place]   [address]  [phone]      Patient denies SI/HI:  Patient no longer endorsing SI/HI or other thoughts of self harm.   Safety Planning and Suicide Prevention discussed:  .Reviewed with all patients during discharge planning group     Catherina Pates, [name] 07/12/2014, 11:54 AM
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
## 2014-07-13 ENCOUNTER — Encounter (HOSPITAL_COMMUNITY): Payer: Self-pay | Admitting: Licensed Clinical Social Worker

## 2014-07-13 ENCOUNTER — Other Ambulatory Visit (HOSPITAL_COMMUNITY): Payer: Private Health Insurance - Indemnity | Attending: Psychiatry | Admitting: Licensed Clinical Social Worker

## 2014-07-13 NOTE — Clinical Social Work Note (Signed)
CSW spoke with Towanda [facility]alk[facility]Mary Alice in [facility].  She advised patient was there with her and stated she is unable to attend PHP.  Patient advised the program is much too stressful for her and she does not believe it would help her.  She also advised she is trying to find some place else to stay and will not have transportation to get to the program.  CSW advised Towanda MalkinMary Alice that patient has the right to make her own decision about treatment.  She was informed patient is scheduled with Tomma LightningFrankie for counseling on July 28, 2013 and to keep the appointment in place.  She was also informed if patient is not certain where she will be living, CSW would not be able to look for other options.

## 2014-07-13 NOTE — Progress Notes (Signed)
Patient Discharge Instructions:  Next Level Care Provider Has Access to the EMR, 07/13/14  Records [facility] via [facility]/Epic access.  Jerelene ReddenSheena E Snead, 07/13/2014, 3:00 PM
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
## 2014-07-28 ENCOUNTER — Ambulatory Visit (HOSPITAL_COMMUNITY): Payer: Self-pay | Admitting: Clinical

## 2014-08-16 ENCOUNTER — Ambulatory Visit (HOSPITAL_COMMUNITY): Payer: Self-pay | Admitting: Psychiatry

## 2015-08-18 ENCOUNTER — Other Ambulatory Visit: Payer: Self-pay | Admitting: Pediatrics

## 2015-08-18 ENCOUNTER — Ambulatory Visit
Admission: RE | Admit: 2015-08-18 | Discharge: 2015-08-18 | Disposition: A | Discharge status: Home / Self Care | Payer: BLUE CROSS/BLUE SHIELD | Source: Ambulatory Visit | Attending: Pediatrics | Admitting: Pediatrics

## 2015-08-18 DIAGNOSIS — R05 Cough: Secondary | ICD-10-CM

## 2015-08-18 DIAGNOSIS — R059 Cough, unspecified: Secondary | ICD-10-CM

## 2015-10-03 ENCOUNTER — Other Ambulatory Visit: Payer: Self-pay | Admitting: Family

## 2015-10-03 ENCOUNTER — Ambulatory Visit
Admission: RE | Admit: 2015-10-03 | Discharge: 2015-10-03 | Disposition: A | Discharge status: Home / Self Care | Payer: BLUE CROSS/BLUE SHIELD | Source: Ambulatory Visit | Attending: Family | Admitting: Family

## 2015-10-03 DIAGNOSIS — R059 Cough, unspecified: Secondary | ICD-10-CM

## 2015-10-03 DIAGNOSIS — R05 Cough: Secondary | ICD-10-CM

## 2016-08-15 DIAGNOSIS — M530 Cervicocranial syndrome: Secondary | ICD-10-CM | POA: Diagnosis not present

## 2016-08-15 DIAGNOSIS — M9903 Segmental and somatic dysfunction of lumbar region: Secondary | ICD-10-CM | POA: Diagnosis not present

## 2016-08-15 DIAGNOSIS — M9901 Segmental and somatic dysfunction of cervical region: Secondary | ICD-10-CM | POA: Diagnosis not present

## 2016-08-15 DIAGNOSIS — M5416 Radiculopathy, lumbar region: Secondary | ICD-10-CM | POA: Diagnosis not present

## 2016-08-19 DIAGNOSIS — M9903 Segmental and somatic dysfunction of lumbar region: Secondary | ICD-10-CM | POA: Diagnosis not present

## 2016-08-19 DIAGNOSIS — M530 Cervicocranial syndrome: Secondary | ICD-10-CM | POA: Diagnosis not present

## 2016-08-19 DIAGNOSIS — M5416 Radiculopathy, lumbar region: Secondary | ICD-10-CM | POA: Diagnosis not present

## 2016-08-19 DIAGNOSIS — M9901 Segmental and somatic dysfunction of cervical region: Secondary | ICD-10-CM | POA: Diagnosis not present

## 2016-09-18 DIAGNOSIS — F4310 Post-traumatic stress disorder, unspecified: Secondary | ICD-10-CM | POA: Diagnosis not present

## 2016-10-08 DIAGNOSIS — F4310 Post-traumatic stress disorder, unspecified: Secondary | ICD-10-CM | POA: Diagnosis not present

## 2016-10-11 DIAGNOSIS — F4310 Post-traumatic stress disorder, unspecified: Secondary | ICD-10-CM | POA: Diagnosis not present

## 2016-10-25 DIAGNOSIS — K130 Diseases of lips: Secondary | ICD-10-CM | POA: Diagnosis not present

## 2016-10-25 DIAGNOSIS — J111 Influenza due to unidentified influenza virus with other respiratory manifestations: Secondary | ICD-10-CM | POA: Diagnosis not present

## 2017-02-15 IMAGING — CR DG CHEST 2V
2 series · 2 of 2 positions shown · non-contrast
Comparison: None.

CLINICAL DATA: Persistent cough and congestion.

EXAM:
CHEST  2 VIEW

[w chest pa]
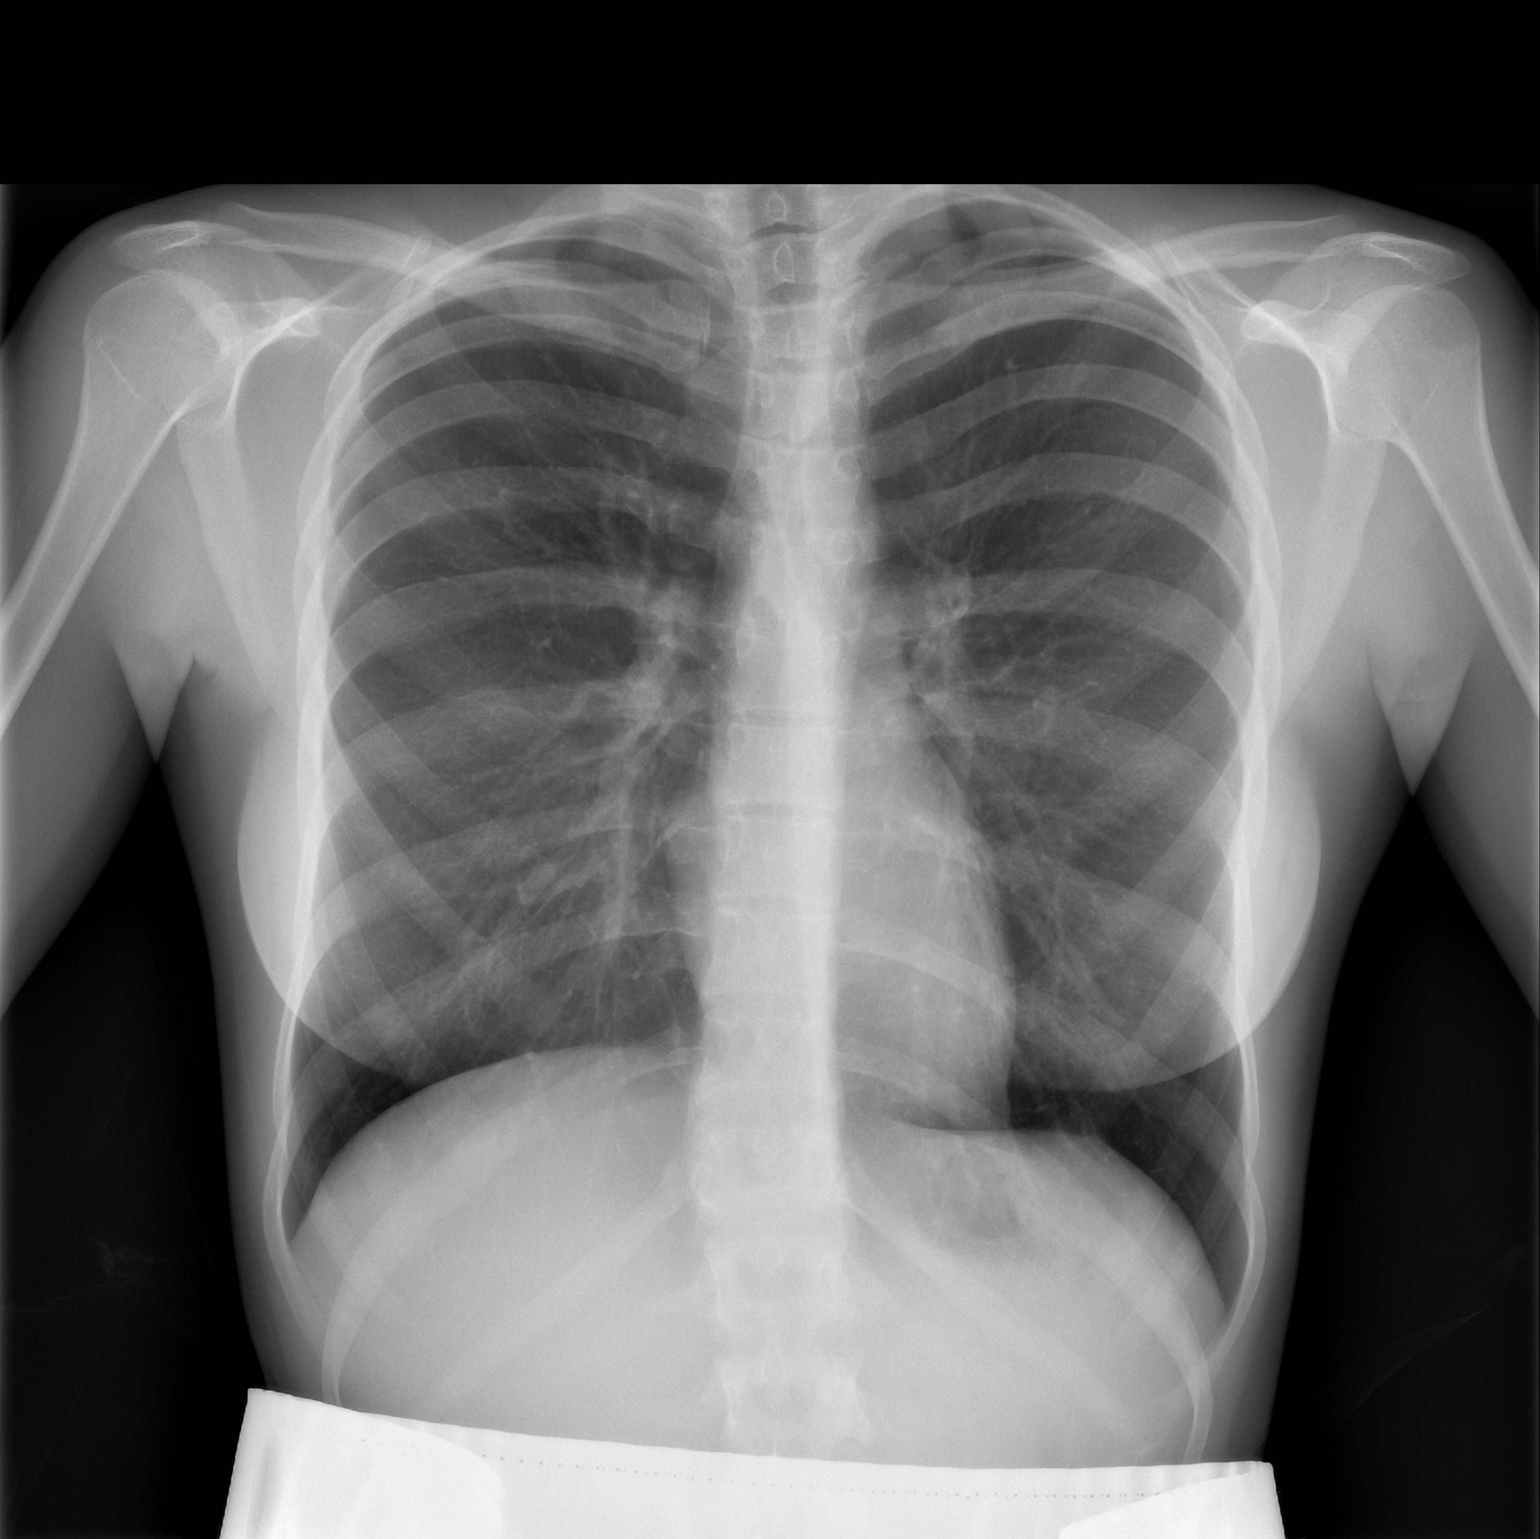

[w chest lat]
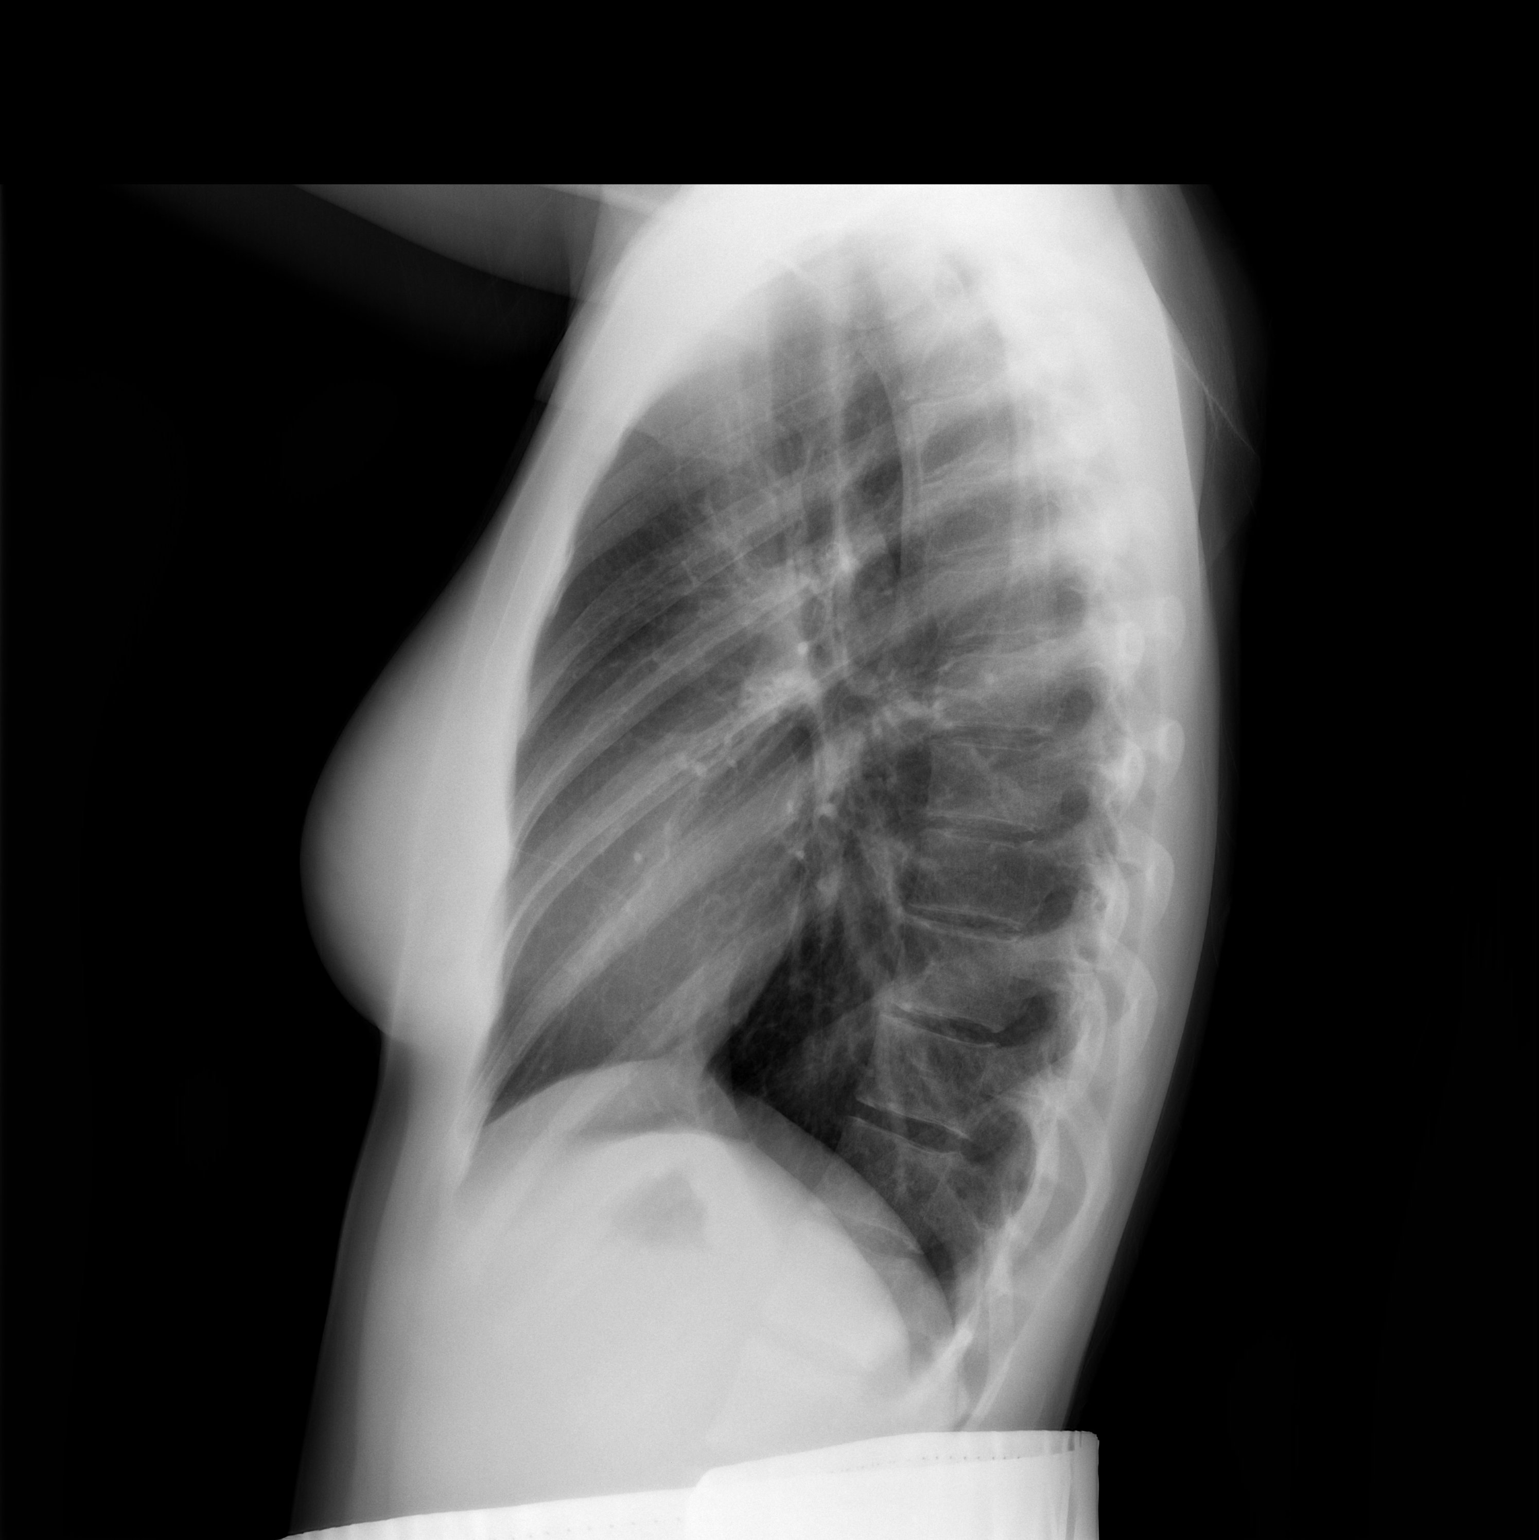

[2 of 2 positions shown; findings below may reference images not displayed]

FINDINGS: The heart size and mediastinal contours are within normal limits.
Both lungs are clear. The visualized skeletal structures are
unremarkable.
IMPRESSION: No active cardiopulmonary disease.

## 2017-03-06 DIAGNOSIS — R5383 Other fatigue: Secondary | ICD-10-CM | POA: Diagnosis not present

## 2017-03-06 DIAGNOSIS — M2480 Other specific joint derangements of unspecified joint, not elsewhere classified: Secondary | ICD-10-CM | POA: Diagnosis not present

## 2017-03-06 DIAGNOSIS — M2550 Pain in unspecified joint: Secondary | ICD-10-CM | POA: Diagnosis not present

## 2017-03-26 DIAGNOSIS — J209 Acute bronchitis, unspecified: Secondary | ICD-10-CM | POA: Diagnosis not present

## 2017-06-25 DIAGNOSIS — R062 Wheezing: Secondary | ICD-10-CM | POA: Diagnosis not present

## 2017-07-08 DIAGNOSIS — J019 Acute sinusitis, unspecified: Secondary | ICD-10-CM | POA: Diagnosis not present

## 2017-07-08 DIAGNOSIS — J45909 Unspecified asthma, uncomplicated: Secondary | ICD-10-CM | POA: Diagnosis not present

## 2017-08-19 DIAGNOSIS — D8989 Other specified disorders involving the immune mechanism, not elsewhere classified: Secondary | ICD-10-CM | POA: Diagnosis not present

## 2017-08-24 DIAGNOSIS — S51809A Unspecified open wound of unspecified forearm, initial encounter: Secondary | ICD-10-CM | POA: Diagnosis not present

## 2017-08-24 DIAGNOSIS — S0993XA Unspecified injury of face, initial encounter: Secondary | ICD-10-CM | POA: Diagnosis not present

## 2017-09-29 DIAGNOSIS — M9901 Segmental and somatic dysfunction of cervical region: Secondary | ICD-10-CM | POA: Diagnosis not present

## 2017-09-29 DIAGNOSIS — M546 Pain in thoracic spine: Secondary | ICD-10-CM | POA: Diagnosis not present

## 2017-09-29 DIAGNOSIS — M542 Cervicalgia: Secondary | ICD-10-CM | POA: Diagnosis not present

## 2017-09-29 DIAGNOSIS — M9902 Segmental and somatic dysfunction of thoracic region: Secondary | ICD-10-CM | POA: Diagnosis not present

## 2017-12-25 DIAGNOSIS — Z7251 High risk heterosexual behavior: Secondary | ICD-10-CM | POA: Diagnosis not present

## 2017-12-25 DIAGNOSIS — Z30432 Encounter for removal of intrauterine contraceptive device: Secondary | ICD-10-CM | POA: Diagnosis not present

## 2017-12-25 DIAGNOSIS — Z789 Other specified health status: Secondary | ICD-10-CM | POA: Diagnosis not present

## 2017-12-25 DIAGNOSIS — T8332XD Displacement of intrauterine contraceptive device, subsequent encounter: Secondary | ICD-10-CM | POA: Diagnosis not present

## 2017-12-25 DIAGNOSIS — N898 Other specified noninflammatory disorders of vagina: Secondary | ICD-10-CM | POA: Diagnosis not present

## 2017-12-31 ENCOUNTER — Other Ambulatory Visit: Payer: Self-pay | Admitting: Physician Assistant

## 2017-12-31 DIAGNOSIS — L989 Disorder of the skin and subcutaneous tissue, unspecified: Secondary | ICD-10-CM

## 2017-12-31 DIAGNOSIS — J309 Allergic rhinitis, unspecified: Secondary | ICD-10-CM | POA: Diagnosis not present

## 2017-12-31 DIAGNOSIS — R946 Abnormal results of thyroid function studies: Secondary | ICD-10-CM | POA: Diagnosis not present

## 2017-12-31 DIAGNOSIS — J4520 Mild intermittent asthma, uncomplicated: Secondary | ICD-10-CM | POA: Diagnosis not present

## 2017-12-31 DIAGNOSIS — F418 Other specified anxiety disorders: Secondary | ICD-10-CM | POA: Diagnosis not present

## 2017-12-31 DIAGNOSIS — R5383 Other fatigue: Secondary | ICD-10-CM | POA: Diagnosis not present

## 2017-12-31 DIAGNOSIS — R52 Pain, unspecified: Secondary | ICD-10-CM | POA: Diagnosis not present

## 2018-01-16 ENCOUNTER — Other Ambulatory Visit: Payer: Self-pay

## 2018-02-25 ENCOUNTER — Other Ambulatory Visit: Payer: Self-pay | Admitting: Obstetrics and Gynecology

## 2018-02-25 ENCOUNTER — Other Ambulatory Visit (HOSPITAL_COMMUNITY)
Admission: RE | Admit: 2018-02-25 | Discharge: 2018-02-25 | Disposition: A | Discharge status: Home / Self Care | Payer: BLUE CROSS/BLUE SHIELD | Source: Ambulatory Visit | Attending: Obstetrics and Gynecology | Admitting: Obstetrics and Gynecology

## 2018-02-25 DIAGNOSIS — Z01411 Encounter for gynecological examination (general) (routine) with abnormal findings: Secondary | ICD-10-CM | POA: Diagnosis not present

## 2018-02-25 DIAGNOSIS — Z01419 Encounter for gynecological examination (general) (routine) without abnormal findings: Secondary | ICD-10-CM | POA: Diagnosis not present

## 2018-02-25 DIAGNOSIS — Z113 Encounter for screening for infections with a predominantly sexual mode of transmission: Secondary | ICD-10-CM | POA: Diagnosis not present

## 2018-02-27 LAB — CYTOLOGY - PAP: DIAGNOSIS: NEGATIVE

## 2018-04-13 DIAGNOSIS — F322 Major depressive disorder, single episode, severe without psychotic features: Secondary | ICD-10-CM | POA: Diagnosis not present

## 2018-04-13 DIAGNOSIS — F909 Attention-deficit hyperactivity disorder, unspecified type: Secondary | ICD-10-CM | POA: Diagnosis not present

## 2018-04-21 DIAGNOSIS — F332 Major depressive disorder, recurrent severe without psychotic features: Secondary | ICD-10-CM | POA: Diagnosis not present

## 2018-04-23 DIAGNOSIS — F322 Major depressive disorder, single episode, severe without psychotic features: Secondary | ICD-10-CM | POA: Diagnosis not present

## 2018-04-23 DIAGNOSIS — F909 Attention-deficit hyperactivity disorder, unspecified type: Secondary | ICD-10-CM | POA: Diagnosis not present

## 2018-05-19 DIAGNOSIS — F332 Major depressive disorder, recurrent severe without psychotic features: Secondary | ICD-10-CM | POA: Diagnosis not present

## 2018-06-29 DIAGNOSIS — Z0000 Encounter for general adult medical examination without abnormal findings: Secondary | ICD-10-CM | POA: Diagnosis not present

## 2018-06-29 DIAGNOSIS — J4520 Mild intermittent asthma, uncomplicated: Secondary | ICD-10-CM | POA: Diagnosis not present

## 2018-06-29 DIAGNOSIS — Z13220 Encounter for screening for lipoid disorders: Secondary | ICD-10-CM | POA: Diagnosis not present

## 2018-06-29 DIAGNOSIS — J019 Acute sinusitis, unspecified: Secondary | ICD-10-CM | POA: Diagnosis not present

## 2018-06-29 DIAGNOSIS — F418 Other specified anxiety disorders: Secondary | ICD-10-CM | POA: Diagnosis not present

## 2018-07-07 DIAGNOSIS — F332 Major depressive disorder, recurrent severe without psychotic features: Secondary | ICD-10-CM | POA: Diagnosis not present

## 2018-08-05 DIAGNOSIS — F909 Attention-deficit hyperactivity disorder, unspecified type: Secondary | ICD-10-CM | POA: Diagnosis not present

## 2018-08-05 DIAGNOSIS — F321 Major depressive disorder, single episode, moderate: Secondary | ICD-10-CM | POA: Diagnosis not present

## 2018-08-05 DIAGNOSIS — F410 Panic disorder [episodic paroxysmal anxiety] without agoraphobia: Secondary | ICD-10-CM | POA: Diagnosis not present

## 2018-08-14 DIAGNOSIS — J4531 Mild persistent asthma with (acute) exacerbation: Secondary | ICD-10-CM | POA: Diagnosis not present

## 2018-08-14 DIAGNOSIS — B9689 Other specified bacterial agents as the cause of diseases classified elsewhere: Secondary | ICD-10-CM | POA: Diagnosis not present

## 2018-08-14 DIAGNOSIS — J019 Acute sinusitis, unspecified: Secondary | ICD-10-CM | POA: Diagnosis not present

## 2018-08-17 DIAGNOSIS — F332 Major depressive disorder, recurrent severe without psychotic features: Secondary | ICD-10-CM | POA: Diagnosis not present

## 2018-09-14 DIAGNOSIS — F332 Major depressive disorder, recurrent severe without psychotic features: Secondary | ICD-10-CM | POA: Diagnosis not present

## 2018-10-03 ENCOUNTER — Emergency Department (HOSPITAL_COMMUNITY)
Admission: EM | Admit: 2018-10-03 | Discharge: 2018-10-03 | Disposition: A | Discharge status: Home / Self Care | Payer: BLUE CROSS/BLUE SHIELD | Attending: Emergency Medicine | Admitting: Emergency Medicine

## 2018-10-03 ENCOUNTER — Other Ambulatory Visit: Payer: Self-pay

## 2018-10-03 ENCOUNTER — Emergency Department (HOSPITAL_COMMUNITY): Payer: BLUE CROSS/BLUE SHIELD

## 2018-10-03 ENCOUNTER — Encounter (HOSPITAL_COMMUNITY): Payer: Self-pay | Admitting: Emergency Medicine

## 2018-10-03 DIAGNOSIS — J45909 Unspecified asthma, uncomplicated: Secondary | ICD-10-CM | POA: Diagnosis not present

## 2018-10-03 DIAGNOSIS — R1110 Vomiting, unspecified: Secondary | ICD-10-CM | POA: Diagnosis not present

## 2018-10-03 DIAGNOSIS — R05 Cough: Secondary | ICD-10-CM | POA: Diagnosis not present

## 2018-10-03 DIAGNOSIS — Z79899 Other long term (current) drug therapy: Secondary | ICD-10-CM | POA: Diagnosis not present

## 2018-10-03 DIAGNOSIS — J101 Influenza due to other identified influenza virus with other respiratory manifestations: Secondary | ICD-10-CM | POA: Diagnosis not present

## 2018-10-03 DIAGNOSIS — J111 Influenza due to unidentified influenza virus with other respiratory manifestations: Secondary | ICD-10-CM | POA: Diagnosis not present

## 2018-10-03 DIAGNOSIS — R69 Illness, unspecified: Secondary | ICD-10-CM

## 2018-10-03 DIAGNOSIS — R0602 Shortness of breath: Secondary | ICD-10-CM | POA: Diagnosis not present

## 2018-10-03 MED ORDER — IBUPROFEN 800 MG PO TABS: 800.0000 mg | ORAL_TABLET | Freq: Three times a day (TID) | ORAL | 0 refills | Status: DC

## 2018-10-03 MED ORDER — OSELTAMIVIR PHOSPHATE 75 MG PO CAPS: 75.0000 mg | ORAL_CAPSULE | Freq: Two times a day (BID) | ORAL | 0 refills | Status: DC

## 2018-10-03 MED ORDER — ONDANSETRON 4 MG PO TBDP: 4.0000 mg | ORAL_TABLET | ORAL | 0 refills | Status: DC | PRN

## 2018-10-03 NOTE — ED Notes (Signed)
Patient transported to X-ray 

## 2018-10-03 NOTE — ED Provider Notes (Signed)
[facility] EMERGENCY DEPARTMENT Provider Note   CSN: [id number] Arrival date & time: 10/03/18  1018    History   Chief Complaint Chief Complaint  Patient presents with  . Cough  . Laryngitis  . Shortness of Breath    HPI Paula Whitaker is a 23 y.o. female.     HPI Patient was well yesterday evening.  She went out with a friend to dinner and then had a couple of beers.  She reports that this morning she awakened at about 4 AM feeling really sick.  She is achy all over.  She is having laryngitis and comes and coughing.  She reports she vomited a couple of times.  No fever documented at home.  Patient has history of asthma but did not feel that she needed to use her inhalers this morning.  She does however note she felt slightly short of breath.  Patient denies pain or burning with urination.  Patient's travel history includes a trip to [place] with return last Tuesday.  While there she was around a lot of people at a music festival. Past Medical History:  Diagnosis Date  . Asthma   . Depression     Patient Active Problem List   Diagnosis Date Noted  . Major depressive disorder, single episode, moderate (HCC)   . Cocaine abuse (HCC) 07/09/2014  . Mood disorder (HCC) 07/09/2014  . Suicidal ideations 07/09/2014  . MDD (major depressive disorder) 07/09/2014  . Cannabis abuse 09/09/2013  . Unspecified episodic mood disorder 09/08/2013  . GAD (generalized anxiety disorder) 05/28/2012    Past Surgical History:  Procedure Laterality Date  . TONSILLECTOMY AND ADENOIDECTOMY       OB History   No obstetric history on file.      Home Medications    Prior to Admission medications   Medication Sig Start Date End Date Taking? Authorizing Provider  albuterol (PROAIR HFA) 108 (90 BASE) MCG/ACT inhaler Inhale 2 puffs into the lungs every 6 (six) hours as needed for wheezing or shortness of breath. 07/12/14   Armandina Stammer I, NP  hydrOXYzine (ATARAX/VISTARIL) 25  MG tablet Take 1 tablet (25 mg total) by mouth every 6 (six) hours as needed for anxiety. 07/12/14   Armandina Stammer I, NP  ibuprofen (ADVIL,MOTRIN) 800 MG tablet Take 1 tablet (800 mg total) by mouth 3 (three) times daily. 10/03/18   Arby Barrette, MD  lidocaine (LIDODERM) 5 % Place 1 patch onto the skin daily. Remove & Discard patch within 12 hours or as directed by MD: For pain management 07/12/14   Armandina Stammer I, NP  Multiple Vitamin (MULTIVITAMIN WITH MINERALS) TABS tablet Take 1 tablet by mouth daily. For low vitamin 07/12/14   Armandina Stammer I, NP  ondansetron (ZOFRAN ODT) 4 MG disintegrating tablet Take 1 tablet (4 mg total) by mouth every 4 (four) hours as needed for nausea or vomiting. 10/03/18   Arby Barrette, MD  oseltamivir (TAMIFLU) 75 MG capsule Take 1 capsule (75 mg total) by mouth every 12 (twelve) hours. 10/03/18   Arby Barrette, MD  propranolol (INDERAL) 10 MG tablet Take 1 tablet (10 mg total) by mouth 2 (two) times daily as needed (anxiety). 07/12/14   Armandina Stammer I, NP  traZODone (DESYREL) 50 MG tablet Take 1 tablet (50 mg total) by mouth at bedtime as needed for sleep. 07/12/14   Sanjuana Kava, NP    Family History Family History  Problem Relation Age of Onset  . Drug abuse Paternal  Aunt     Social History Social History   Tobacco Use  . Smoking status: Never Smoker  . Smokeless tobacco: Never Used  Substance Use Topics  . Alcohol use: Yes  . Drug use: Yes    Types: Marijuana     Allergies   Patient has no known allergies.   Review of Systems Review of Systems 10 Systems reviewed and are negative for acute change except as noted in the HPI.   Physical Exam Updated Vital Signs BP 139/83 (BP Location: Right Arm)   Pulse 78   Temp 98.4 F (36.9 C) (Oral)   Resp 17   Ht 5\' 6"  (1.676 m)   Wt 61.2 kg   LMP 09/26/2018   SpO2 98%   BMI 21.79 kg/m   Physical Exam Constitutional:      Comments: Patient is alert and nontoxic.  No respiratory  distress.  Color is good.  HENT:     Head: Normocephalic and atraumatic.     Right Ear: Tympanic membrane normal.     Left Ear: Tympanic membrane normal.     Nose: Nose normal.     Mouth/Throat:     Mouth: Mucous membranes are moist.     Pharynx: Oropharynx is clear.     Comments: Posterior oropharynx widely patent.  No erythema or exudates of the tonsillar areas.  Voice is slightly hoarse.  No stridor. Eyes:     Extraocular Movements: Extraocular movements intact.     Conjunctiva/sclera: Conjunctivae normal.     Pupils: Pupils are equal, round, and reactive to light.  Neck:     Musculoskeletal: Neck supple.  Cardiovascular:     Rate and Rhythm: Normal rate and regular rhythm.     Pulses: Normal pulses.  Pulmonary:     Effort: Pulmonary effort is normal.     Breath sounds: Normal breath sounds.     Comments: Breath sounds are clear in all lung fields with no wheeze or crackle.  Very good airflow to the bases.  No cough paroxysmal's with deep inspiration. Abdominal:     General: There is no distension.     Palpations: Abdomen is soft.     Tenderness: There is no abdominal tenderness. There is no guarding.  Musculoskeletal: Normal range of motion.        General: No swelling or tenderness.  Skin:    General: Skin is warm and dry.  Neurological:     General: No focal deficit present.     Mental Status: She is oriented to person, place, and time.     Coordination: Coordination normal.  Psychiatric:        Mood and Affect: Mood normal.      ED Treatments / Results  Labs (all labs ordered are listed, but only abnormal results are displayed) Labs Reviewed - No data to display  EKG None  Radiology No results found.  Procedures Procedures (including critical care time)  Medications Ordered in ED Medications - No data to display   Initial Impression / Assessment and Plan / ED Course  I have reviewed the triage vital signs and the nursing notes.  Pertinent labs &  imaging results that were available during my care of the patient were reviewed by me and considered in my medical decision making (see chart for details).       Patient is vital signs are stable.  Symptoms onset was in the early hours of this morning less than approximately 8 hours at this time.  Patient has no risk factors at this time indicate coronavirus testing.  She is stable and symptoms are also very consistent with flulike illness.  Patient is given a prescription for Tamiflu to initiate within the next 24 hours if symptoms are not improving with rest and hydration.  Return precautions are reviewed.  Home contact precautions and isolation has been reviewed with the patient and her mother who is present.  Final Clinical Impressions(s) / ED Diagnoses   Final diagnoses:  Influenza-like illness    ED Discharge Orders         Ordered    ondansetron (ZOFRAN ODT) 4 MG disintegrating tablet  Every 4 hours PRN     10/03/18 1121    oseltamivir (TAMIFLU) 75 MG capsule  Every 12 hours     10/03/18 1121    ibuprofen (ADVIL,MOTRIN) 800 MG tablet  3 times daily     10/03/18 1121           Arby BarrettePfeiffer, Evalyne Cortopassi, MD 10/03/18 1128

## 2018-10-03 NOTE — Discharge Instructions (Signed)
1.  At this time, you do not have the symptom profile that indicates testing for coronavirus.  It is very important that everyone who is ill stay at home and have as little contact with others as possible.  Continue to use precautions when coughing and for frequent handwashing. 2.  Return to the emergency department if you are having increasing shortness of breath, worsening symptoms generally. 3.  At this time, influenza is still very common.  You are being given a prescription for Tamiflu.  If by evening, you are not feeling significantly improved with rest and hydration, you may start Tamiflu within 2 days of your symptoms. 4.  Schedule follow-up with your doctor within 1 week.

## 2018-10-03 NOTE — ED Notes (Signed)
Patient verbalizes understanding of discharge instructions . Opportunity for questions and answers were provided . Armband removed by staff ,Pt discharged from ED. W/C  offered at D/C  and Declined W/C at D/C and was escorted to lobby by RN.  

## 2018-10-03 NOTE — ED Triage Notes (Signed)
Pt. Stated, I woke up with  Hoarseness, cough and some SOB

## 2018-10-07 DIAGNOSIS — B349 Viral infection, unspecified: Secondary | ICD-10-CM | POA: Diagnosis not present

## 2018-10-07 DIAGNOSIS — R05 Cough: Secondary | ICD-10-CM | POA: Diagnosis not present

## 2018-10-12 DIAGNOSIS — F332 Major depressive disorder, recurrent severe without psychotic features: Secondary | ICD-10-CM | POA: Diagnosis not present

## 2018-11-20 ENCOUNTER — Other Ambulatory Visit: Payer: Self-pay | Admitting: Physician Assistant

## 2018-11-20 ENCOUNTER — Ambulatory Visit
Admission: RE | Admit: 2018-11-20 | Discharge: 2018-11-20 | Disposition: A | Discharge status: Home / Self Care | Payer: BLUE CROSS/BLUE SHIELD | Source: Ambulatory Visit | Attending: Physician Assistant | Admitting: Physician Assistant

## 2018-11-20 ENCOUNTER — Other Ambulatory Visit: Payer: Self-pay

## 2018-11-20 DIAGNOSIS — R112 Nausea with vomiting, unspecified: Secondary | ICD-10-CM | POA: Diagnosis not present

## 2018-11-20 DIAGNOSIS — R51 Headache: Secondary | ICD-10-CM | POA: Diagnosis not present

## 2018-11-20 DIAGNOSIS — R2689 Other abnormalities of gait and mobility: Secondary | ICD-10-CM | POA: Diagnosis not present

## 2018-11-20 DIAGNOSIS — S060X0A Concussion without loss of consciousness, initial encounter: Secondary | ICD-10-CM | POA: Diagnosis not present

## 2018-11-20 DIAGNOSIS — R110 Nausea: Secondary | ICD-10-CM

## 2018-12-08 ENCOUNTER — Telehealth: Payer: Self-pay | Admitting: *Deleted

## 2018-12-08 NOTE — Telephone Encounter (Signed)
Tried calling pt on home/cell number. Both went to VM. LVM for pt asking her to check in at 830am. Explained new check in procedure.

## 2018-12-09 ENCOUNTER — Ambulatory Visit (INDEPENDENT_AMBULATORY_CARE_PROVIDER_SITE_OTHER): Payer: BLUE CROSS/BLUE SHIELD | Admitting: Neurology

## 2018-12-09 ENCOUNTER — Encounter: Payer: Self-pay | Admitting: Neurology

## 2018-12-09 ENCOUNTER — Other Ambulatory Visit: Payer: Self-pay

## 2018-12-09 VITALS — BP 131/86 | HR 67 | Temp 97.8°F | Ht 66.0 in | Wt 139.0 lb

## 2018-12-09 DIAGNOSIS — R2689 Other abnormalities of gait and mobility: Secondary | ICD-10-CM | POA: Diagnosis not present

## 2018-12-09 DIAGNOSIS — R200 Anesthesia of skin: Secondary | ICD-10-CM

## 2018-12-09 DIAGNOSIS — G44329 Chronic post-traumatic headache, not intractable: Secondary | ICD-10-CM | POA: Diagnosis not present

## 2018-12-09 DIAGNOSIS — R350 Frequency of micturition: Secondary | ICD-10-CM | POA: Insufficient documentation

## 2018-12-09 DIAGNOSIS — G44321 Chronic post-traumatic headache, intractable: Secondary | ICD-10-CM | POA: Diagnosis not present

## 2018-12-09 MED ORDER — IMIPRAMINE HCL 25 MG PO TABS: 25.0000 mg | ORAL_TABLET | Freq: Every day | ORAL | 5 refills | Status: DC
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
## 2018-12-16 ENCOUNTER — Telehealth: Payer: Self-pay | Admitting: Neurology

## 2018-12-16 NOTE — Telephone Encounter (Signed)
lvm for pt to call back about scheduling mri  [employer] Auth: [employer] spoke to [place] Ref # [id number]

## 2018-12-22 NOTE — Telephone Encounter (Signed)
I called the patient and spoke to her she is scheduled for 12/29/18 at [facility].

## 2018-12-23 ENCOUNTER — Other Ambulatory Visit: Payer: Self-pay | Admitting: *Deleted

## 2018-12-23 ENCOUNTER — Other Ambulatory Visit: Payer: Self-pay | Admitting: Neurology

## 2018-12-23 MED ORDER — IMIPRAMINE HCL 25 MG PO TABS: ORAL_TABLET | ORAL | 1 refills | Status: DC

## 2018-12-29 ENCOUNTER — Other Ambulatory Visit: Payer: Self-pay

## 2018-12-29 ENCOUNTER — Ambulatory Visit (INDEPENDENT_AMBULATORY_CARE_PROVIDER_SITE_OTHER): Payer: BC Managed Care – PPO

## 2018-12-29 ENCOUNTER — Other Ambulatory Visit: Payer: Self-pay | Admitting: Neurology

## 2018-12-29 DIAGNOSIS — G44329 Chronic post-traumatic headache, not intractable: Secondary | ICD-10-CM

## 2018-12-29 DIAGNOSIS — R350 Frequency of micturition: Secondary | ICD-10-CM

## 2018-12-29 DIAGNOSIS — R2689 Other abnormalities of gait and mobility: Secondary | ICD-10-CM

## 2018-12-29 DIAGNOSIS — R200 Anesthesia of skin: Secondary | ICD-10-CM | POA: Diagnosis not present

## 2018-12-30 ENCOUNTER — Telehealth: Payer: Self-pay | Admitting: *Deleted

## 2018-12-30 DIAGNOSIS — J309 Allergic rhinitis, unspecified: Secondary | ICD-10-CM | POA: Diagnosis not present

## 2018-12-30 DIAGNOSIS — F418 Other specified anxiety disorders: Secondary | ICD-10-CM | POA: Diagnosis not present

## 2018-12-30 DIAGNOSIS — J4520 Mild intermittent asthma, uncomplicated: Secondary | ICD-10-CM | POA: Diagnosis not present

## 2018-12-30 NOTE — Telephone Encounter (Signed)
-----   Message from Britt Bottom, MD sent at 12/29/2018  6:16 PM EDT ----- Please let her know that the MRI shows that the brain and spinal cord looks normal.  She does have some sinusitis.  If she is having sinusitis symptoms we can call in a Z-Pak.

## 2018-12-30 NOTE — Telephone Encounter (Signed)
Called, LVM for pt about results per Dr. Felecia Shelling note. Asked her to call back if she would like [place] to call in z-pak if she is having sinusitis sx.

## 2019-01-20 ENCOUNTER — Ambulatory Visit (INDEPENDENT_AMBULATORY_CARE_PROVIDER_SITE_OTHER): Payer: BC Managed Care – PPO | Admitting: Family Medicine

## 2019-01-20 ENCOUNTER — Encounter: Payer: Self-pay | Admitting: Family Medicine

## 2019-01-20 ENCOUNTER — Other Ambulatory Visit: Payer: Self-pay

## 2019-01-20 VITALS — BP 133/77 | HR 85 | Temp 97.7°F | Ht 65.0 in | Wt 136.0 lb

## 2019-01-20 DIAGNOSIS — G44319 Acute post-traumatic headache, not intractable: Secondary | ICD-10-CM | POA: Insufficient documentation

## 2019-01-20 MED ORDER — ONDANSETRON HCL 4 MG PO TABS: 4.0000 mg | ORAL_TABLET | Freq: Three times a day (TID) | ORAL | 11 refills | Status: AC | PRN

## 2019-01-20 MED ORDER — RIZATRIPTAN BENZOATE 10 MG PO TABS: 10.0000 mg | ORAL_TABLET | ORAL | 11 refills | Status: DC | PRN

## 2019-01-20 NOTE — Patient Instructions (Signed)
Continue current treatment  Maxalt as needed for abortive therapy  Zofran as needed for nausea   General Headache Without Cause A headache is pain or discomfort that is felt around the head or neck area. There are many causes and types of headaches. In some cases, the cause may not be found. Follow these instructions at home: Watch your condition for any changes. Let your doctor know about them. Take these steps to help with your condition: Managing pain      Take over-the-counter and prescription medicines only as told by your doctor.  Lie down in a dark, quiet room when you have a headache.  If told, put ice on your head and neck area: ? Put ice in a plastic bag. ? Place a towel between your skin and the bag. ? Leave the ice on for 20 minutes, 2-3 times per day.  If told, put heat on the affected area. Use the heat source that your doctor recommends, such as a moist heat pack or a heating pad. ? Place a towel between your skin and the heat source. ? Leave the heat on for 20-30 minutes. ? Remove the heat if your skin turns bright red. This is very important if you are unable to feel pain, heat, or cold. You may have a greater risk of getting burned.  Keep lights dim if bright lights bother you or make your headaches worse. Eating and drinking  Eat meals on a regular schedule.  If you drink alcohol: ? Limit how much you use to:  0-1 drink a day for women.  0-2 drinks a day for men. ? Be aware of how much alcohol is in your drink. In the [place] one drink equals one 12 oz bottle of beer (355 mL), one 5 oz glass of wine (148 mL), or one 1 oz glass of hard liquor (44 mL).  Stop drinking caffeine, or reduce how much caffeine you drink. General instructions   Keep a journal to find out if certain things bring on headaches. For example, write down: ? What you eat and drink. ? How much sleep you get. ? Any change to your diet or medicines.  Get a massage or try other ways  to relax.  Limit stress.  Sit up straight. Do not tighten (tense) your muscles.  Do not use any products that contain nicotine or tobacco. This includes cigarettes, e-cigarettes, and chewing tobacco. If you need help quitting, ask your doctor.  Exercise regularly as told by your doctor.  Get enough sleep. This often means 7-9 hours of sleep each night.  Keep all follow-up visits as told by your doctor. This is important. Contact a doctor if:  Your symptoms are not helped by medicine.  You have a headache that feels different than the other headaches.  You feel sick to your stomach (nauseous) or you throw up (vomit).  You have a fever. Get help right away if:  Your headache gets very bad quickly.  Your headache gets worse after a lot of physical activity.  You keep throwing up.  You have a stiff neck.  You have trouble seeing.  You have trouble speaking.  You have pain in the eye or ear.  Your muscles are weak or you lose muscle control.  You lose your balance or have trouble walking.  You feel like you will pass out (faint) or you pass out.  You are mixed up (confused).  You have a seizure. Summary  A headache is  pain or discomfort that is felt around the head or neck area.  There are many causes and types of headaches. In some cases, the cause may not be found.  Keep a journal to help find out what causes your headaches. Watch your condition for any changes. Let your doctor know about them.  Contact a doctor if you have a headache that is different from usual, or if your headache is not helped by medicine.  Get help right away if your headache gets very bad, you throw up, you have trouble seeing, you lose your balance, or you have a seizure. This information is not intended to replace advice given to you by your health care provider. Make sure you discuss any questions you have with your health care provider. Document Released: 04/16/2008 Document Revised:  01/26/2018 Document Reviewed: 01/26/2018 Elsevier Patient Education  2020 [employer]
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
## 2019-01-21 ENCOUNTER — Encounter: Payer: Self-pay | Admitting: Family Medicine

## 2019-01-21 NOTE — Progress Notes (Signed)
I have read the note, and I agree with the clinical assessment and plan.  Richard A. Sater, MD, PhD, FAAN Certified in Neurology, Clinical Neurophysiology, Sleep Medicine, Pain Medicine and Neuroimaging  [facility] [address], [address] [place], [place] [address] [phone]  

## 2019-03-02 DIAGNOSIS — Z01419 Encounter for gynecological examination (general) (routine) without abnormal findings: Secondary | ICD-10-CM | POA: Diagnosis not present

## 2019-03-02 DIAGNOSIS — Z113 Encounter for screening for infections with a predominantly sexual mode of transmission: Secondary | ICD-10-CM | POA: Diagnosis not present

## 2019-04-06 DIAGNOSIS — F411 Generalized anxiety disorder: Secondary | ICD-10-CM | POA: Diagnosis not present

## 2019-05-03 DIAGNOSIS — F411 Generalized anxiety disorder: Secondary | ICD-10-CM | POA: Diagnosis not present

## 2019-05-12 DIAGNOSIS — J4520 Mild intermittent asthma, uncomplicated: Secondary | ICD-10-CM | POA: Diagnosis not present

## 2019-05-12 DIAGNOSIS — R05 Cough: Secondary | ICD-10-CM | POA: Diagnosis not present

## 2019-05-13 DIAGNOSIS — R05 Cough: Secondary | ICD-10-CM | POA: Diagnosis not present

## 2019-05-31 DIAGNOSIS — M542 Cervicalgia: Secondary | ICD-10-CM | POA: Diagnosis not present

## 2019-05-31 DIAGNOSIS — G44309 Post-traumatic headache, unspecified, not intractable: Secondary | ICD-10-CM | POA: Diagnosis not present

## 2019-05-31 DIAGNOSIS — M25531 Pain in right wrist: Secondary | ICD-10-CM | POA: Diagnosis not present

## 2019-05-31 DIAGNOSIS — M25511 Pain in right shoulder: Secondary | ICD-10-CM | POA: Diagnosis not present

## 2019-06-09 DIAGNOSIS — F321 Major depressive disorder, single episode, moderate: Secondary | ICD-10-CM | POA: Diagnosis not present

## 2019-06-14 DIAGNOSIS — M797 Fibromyalgia: Secondary | ICD-10-CM | POA: Diagnosis not present

## 2019-06-14 DIAGNOSIS — F411 Generalized anxiety disorder: Secondary | ICD-10-CM | POA: Diagnosis not present

## 2019-06-14 DIAGNOSIS — R45851 Suicidal ideations: Secondary | ICD-10-CM | POA: Diagnosis not present

## 2019-06-14 DIAGNOSIS — F17290 Nicotine dependence, other tobacco product, uncomplicated: Secondary | ICD-10-CM | POA: Diagnosis not present

## 2019-06-14 DIAGNOSIS — J45909 Unspecified asthma, uncomplicated: Secondary | ICD-10-CM | POA: Diagnosis not present

## 2019-06-14 DIAGNOSIS — F331 Major depressive disorder, recurrent, moderate: Secondary | ICD-10-CM | POA: Diagnosis not present

## 2019-06-14 DIAGNOSIS — N390 Urinary tract infection, site not specified: Secondary | ICD-10-CM | POA: Diagnosis not present

## 2019-06-14 DIAGNOSIS — F332 Major depressive disorder, recurrent severe without psychotic features: Secondary | ICD-10-CM | POA: Diagnosis not present

## 2019-06-22 DIAGNOSIS — F332 Major depressive disorder, recurrent severe without psychotic features: Secondary | ICD-10-CM | POA: Diagnosis not present

## 2019-06-22 DIAGNOSIS — L309 Dermatitis, unspecified: Secondary | ICD-10-CM | POA: Diagnosis not present

## 2019-06-22 DIAGNOSIS — K130 Diseases of lips: Secondary | ICD-10-CM | POA: Diagnosis not present

## 2019-06-23 DIAGNOSIS — F321 Major depressive disorder, single episode, moderate: Secondary | ICD-10-CM | POA: Diagnosis not present

## 2019-08-02 DIAGNOSIS — F321 Major depressive disorder, single episode, moderate: Secondary | ICD-10-CM | POA: Diagnosis not present

## 2019-08-09 DIAGNOSIS — F332 Major depressive disorder, recurrent severe without psychotic features: Secondary | ICD-10-CM | POA: Diagnosis not present

## 2019-08-21 DIAGNOSIS — F321 Major depressive disorder, single episode, moderate: Secondary | ICD-10-CM | POA: Diagnosis not present

## 2019-09-16 DIAGNOSIS — R202 Paresthesia of skin: Secondary | ICD-10-CM | POA: Diagnosis not present

## 2019-09-16 DIAGNOSIS — D1722 Benign lipomatous neoplasm of skin and subcutaneous tissue of left arm: Secondary | ICD-10-CM | POA: Diagnosis not present

## 2019-09-16 DIAGNOSIS — M2550 Pain in unspecified joint: Secondary | ICD-10-CM | POA: Diagnosis not present

## 2019-09-16 DIAGNOSIS — R5383 Other fatigue: Secondary | ICD-10-CM | POA: Diagnosis not present

## 2019-09-16 DIAGNOSIS — E559 Vitamin D deficiency, unspecified: Secondary | ICD-10-CM | POA: Diagnosis not present

## 2019-09-16 DIAGNOSIS — R7309 Other abnormal glucose: Secondary | ICD-10-CM | POA: Diagnosis not present

## 2019-09-16 DIAGNOSIS — F321 Major depressive disorder, single episode, moderate: Secondary | ICD-10-CM | POA: Diagnosis not present

## 2019-09-21 DIAGNOSIS — S9031XA Contusion of right foot, initial encounter: Secondary | ICD-10-CM | POA: Diagnosis not present

## 2019-09-21 DIAGNOSIS — S96911A Strain of unspecified muscle and tendon at ankle and foot level, right foot, initial encounter: Secondary | ICD-10-CM | POA: Diagnosis not present

## 2019-09-28 DIAGNOSIS — F332 Major depressive disorder, recurrent severe without psychotic features: Secondary | ICD-10-CM | POA: Diagnosis not present

## 2019-09-30 DIAGNOSIS — F321 Major depressive disorder, single episode, moderate: Secondary | ICD-10-CM | POA: Diagnosis not present

## 2019-10-26 DIAGNOSIS — F909 Attention-deficit hyperactivity disorder, unspecified type: Secondary | ICD-10-CM | POA: Diagnosis not present

## 2019-10-29 DIAGNOSIS — D1739 Benign lipomatous neoplasm of skin and subcutaneous tissue of other sites: Secondary | ICD-10-CM | POA: Diagnosis not present

## 2019-11-22 ENCOUNTER — Other Ambulatory Visit (HOSPITAL_COMMUNITY): Payer: Self-pay | Admitting: Psychiatry

## 2019-11-22 DIAGNOSIS — F909 Attention-deficit hyperactivity disorder, unspecified type: Secondary | ICD-10-CM | POA: Diagnosis not present

## 2019-11-24 DIAGNOSIS — S93401D Sprain of unspecified ligament of right ankle, subsequent encounter: Secondary | ICD-10-CM | POA: Diagnosis not present

## 2019-12-08 DIAGNOSIS — R112 Nausea with vomiting, unspecified: Secondary | ICD-10-CM | POA: Diagnosis not present

## 2019-12-22 DIAGNOSIS — R1084 Generalized abdominal pain: Secondary | ICD-10-CM | POA: Diagnosis not present

## 2019-12-22 DIAGNOSIS — R112 Nausea with vomiting, unspecified: Secondary | ICD-10-CM | POA: Diagnosis not present

## 2019-12-26 ENCOUNTER — Emergency Department (HOSPITAL_COMMUNITY)
Admission: EM | Admit: 2019-12-26 | Discharge: 2019-12-26 | Disposition: A | Discharge status: Home / Self Care | Payer: BC Managed Care – PPO | Attending: Emergency Medicine | Admitting: Emergency Medicine

## 2019-12-26 ENCOUNTER — Other Ambulatory Visit: Payer: Self-pay

## 2019-12-26 ENCOUNTER — Emergency Department (HOSPITAL_COMMUNITY): Payer: BC Managed Care – PPO

## 2019-12-26 ENCOUNTER — Encounter (HOSPITAL_COMMUNITY): Payer: Self-pay | Admitting: Emergency Medicine

## 2019-12-26 DIAGNOSIS — R112 Nausea with vomiting, unspecified: Secondary | ICD-10-CM | POA: Diagnosis not present

## 2019-12-26 DIAGNOSIS — Z79899 Other long term (current) drug therapy: Secondary | ICD-10-CM | POA: Diagnosis not present

## 2019-12-26 DIAGNOSIS — J45909 Unspecified asthma, uncomplicated: Secondary | ICD-10-CM | POA: Insufficient documentation

## 2019-12-26 DIAGNOSIS — R1011 Right upper quadrant pain: Secondary | ICD-10-CM | POA: Diagnosis not present

## 2019-12-26 DIAGNOSIS — R52 Pain, unspecified: Secondary | ICD-10-CM

## 2019-12-26 LAB — I-STAT BETA HCG BLOOD, ED (MC, WL, AP ONLY): I-stat hCG, quantitative: <5 m[IU]/mL (ref <5)

## 2019-12-26 LAB — COMPREHENSIVE METABOLIC PANEL
ALT: 18 U/L (ref 0–44)
AST: 37 U/L (ref 15–41)
Albumin: 4.4 g/dL (ref 3.5–5.0)
Alkaline Phosphatase: 52 U/L (ref 38–126)
Anion gap: 11 (ref 5–15)
BUN: 11 mg/dL (ref 6–20)
CO2: 23 mmol/L (ref 22–32)
Calcium: 9.6 mg/dL (ref 8.9–10.3)
Chloride: 107 mmol/L (ref 98–111)
Creatinine, Ser: 0.86 mg/dL (ref 0.44–1.00)
GFR calc Af Amer: >60 mL/min (ref >60)
GFR calc non Af Amer: >60 mL/min (ref >60)
Glucose, Bld: 113 mg/dL — ABNORMAL HIGH (ref 70–99)
Potassium: 3.4 mmol/L — ABNORMAL LOW (ref 3.5–5.1)
Sodium: 141 mmol/L (ref 135–145)
Total Bilirubin: 0.6 mg/dL (ref 0.3–1.2)
Total Protein: 7.9 g/dL (ref 6.5–8.1)

## 2019-12-26 LAB — CBC
HCT: 40.7 % (ref 36.0–46.0)
Hemoglobin: 13.7 g/dL (ref 12.0–15.0)
MCH: 30 pg (ref 26.0–34.0)
MCHC: 33.7 g/dL (ref 30.0–36.0)
MCV: 89.1 fL (ref 80.0–100.0)
Platelets: 417 10*3/uL — ABNORMAL HIGH (ref 150–400)
RBC: 4.57 MIL/uL (ref 3.87–5.11)
RDW: 13.8 % (ref 11.5–15.5)
WBC: 10.8 10*3/uL — ABNORMAL HIGH (ref 4.0–10.5)
nRBC: 0 % (ref 0.0–0.2)

## 2019-12-26 LAB — URINALYSIS, ROUTINE W REFLEX MICROSCOPIC
Bilirubin Urine: NEGATIVE
Glucose, UA: NEGATIVE mg/dL
Hgb urine dipstick: NEGATIVE
Ketones, ur: NEGATIVE mg/dL
Leukocytes,Ua: NEGATIVE
Nitrite: NEGATIVE
Protein, ur: NEGATIVE mg/dL
Specific Gravity, Urine: 1.002 — ABNORMAL LOW (ref 1.005–1.030)
pH: 6 (ref 5.0–8.0)

## 2019-12-26 LAB — LIPASE, BLOOD: Lipase: 23 U/L (ref 11–51)

## 2019-12-26 MED ORDER — OMEPRAZOLE 40 MG PO CPDR: 40.0000 mg | DELAYED_RELEASE_CAPSULE | Freq: Every day | ORAL | 0 refills | Status: AC

## 2019-12-26 MED ORDER — ONDANSETRON 8 MG PO TBDP
8.0000 mg | ORAL_TABLET | Freq: Once | ORAL | Status: AC
  Administered 2019-12-26: 8 mg via ORAL
  Filled 2019-12-26: qty 1

## 2019-12-26 MED ORDER — ONDANSETRON 4 MG PO TBDP
4.0000 mg | ORAL_TABLET | Freq: Once | ORAL | Status: DC | PRN
  Filled 2019-12-26: qty 1

## 2019-12-26 MED ORDER — SODIUM CHLORIDE 0.9% FLUSH: 3.0000 mL | Freq: Once | INTRAVENOUS | Status: DC

## 2019-12-26 MED ORDER — FAMOTIDINE 20 MG PO TABS
20.0000 mg | ORAL_TABLET | Freq: Once | ORAL | Status: AC
  Administered 2019-12-26: 20 mg via ORAL
  Filled 2019-12-26: qty 1

## 2019-12-26 NOTE — ED Triage Notes (Signed)
Patient states that 2 weeks ago she caught a GI "bug," and still is having vomiting. Patient states she is able to eat, but later regurgitates her food. Patient has followed up with her doctor about this and her PCP stated that if she had bloody emesis to be seen. Patient states she had blood in her emesis tonight.

## 2019-12-26 NOTE — Discharge Instructions (Addendum)

## 2019-12-26 NOTE — ED Provider Notes (Signed)
[facility]-EMERGENCY DEPT Provider Note   CSN: [id number] Arrival date & time: 12/26/19  0239     History Chief Complaint  Patient presents with  . Abdominal Pain  . Emesis    Paula Whitaker is a 24 y.o. female.  The history is provided by the patient.  Abdominal Pain Pain location:  Epigastric and RUQ Pain severity:  Moderate Timing:  Intermittent Progression:  Worsening Chronicity:  New Relieved by:  Nothing Worsened by:  Palpation and movement Associated symptoms: hematemesis, nausea and vomiting   Associated symptoms: no cough, no fever, no hematochezia, no melena, no vaginal bleeding and no vaginal discharge   Emesis Associated symptoms: abdominal pain   Associated symptoms: no cough and no fever   Patient reports over 2 weeks ago she was diagnosed with a "GI bug "she had multiple episodes of vomiting and diarrhea and was seen by her PCP.  She reports the diarrhea resolved, but still has episodes of vomiting usually associated with food.  She also has upper abdominal pain.  She reports tonight she had several episodes of vomiting and on the final episode she had blood mixed in.  The initial vomit was just food.  No recent bloody or black stools.  She does not use NSAIDs.  No Recent travel.  No camping/hiking.      Past Medical History:  Diagnosis Date  . Asthma   . Depression     Patient Active Problem List   Diagnosis Date Noted  . Acute post-traumatic headache 01/20/2019  . Chronic post-traumatic headache 12/09/2018  . Right arm numbness 12/09/2018  . Balance disorder 12/09/2018  . Urinary frequency 12/09/2018  . Major depressive disorder, single episode, moderate (HCC)   . Cocaine abuse (HCC) 07/09/2014  . Mood disorder (HCC) 07/09/2014  . Suicidal ideations 07/09/2014  . MDD (major depressive disorder) 07/09/2014  . Cannabis abuse 09/09/2013  . Unspecified episodic mood disorder 09/08/2013  . GAD (generalized anxiety disorder)  05/28/2012    Past Surgical History:  Procedure Laterality Date  . TONSILLECTOMY AND ADENOIDECTOMY       OB History   No obstetric history on file.     Family History  Problem Relation Age of Onset  . Drug abuse Paternal Aunt     Social History   Tobacco Use  . Smoking status: Never Smoker  . Smokeless tobacco: Never Used  Substance Use Topics  . Alcohol use: Yes    Comment: sometimes  . Drug use: Not Currently    Types: Marijuana    Home Medications Prior to Admission medications   Medication Sig Start Date End Date Taking? Authorizing Provider  albuterol (PROAIR HFA) 108 (90 BASE) MCG/ACT inhaler Inhale 2 puffs into the lungs every 6 (six) hours as needed for wheezing or shortness of breath. 07/12/14   Armandina Stammer I, NP  etonogestrel-ethinyl estradiol (NUVARING) 0.12-0.015 MG/24HR vaginal ring INSERT 1 RING LEAVE IN PLACE FOR 3 WEEKS THEN OUT FOR 1 WEEK 09/25/18   [provider]  gabapentin (NEURONTIN) 100 MG capsule 100 mg daily. 08/17/18   [provider]  Multiple Vitamin (MULTIVITAMIN WITH MINERALS) TABS tablet Take 1 tablet by mouth daily. For low vitamin 07/12/14   Armandina Stammer I, NP  ondansetron (ZOFRAN) 4 MG tablet Take 1 tablet (4 mg total) by mouth every 8 (eight) hours as needed for nausea or vomiting. 01/20/19   Lomax, Amy, NP  imipramine (TOFRANIL) 25 MG tablet TAKE 1 TABLET BY MOUTH EVERYDAY AT BEDTIME 12/23/18  12/26/19  Sater, Pearletha Furl, MD  rizatriptan (MAXALT) 10 MG tablet Take 1 tablet (10 mg total) by mouth as needed for migraine. May repeat in 2 hours if needed 01/20/19 12/26/19  Shawnie Dapper, NP    Allergies    Patient has no known allergies.  Review of Systems   Review of Systems  Constitutional: Negative for fever.  Respiratory: Negative for cough.   Gastrointestinal: Positive for abdominal pain, hematemesis, nausea and vomiting. Negative for hematochezia and melena.  Genitourinary: Negative for vaginal bleeding and vaginal discharge.    All other systems reviewed and are negative.   Physical Exam Updated Vital Signs BP 129/81 (BP Location: Left Arm)   Pulse 95   Temp 98.2 F (36.8 C) (Oral)   Resp 16   Ht 1.676 m (5\' 6" )   Wt 68 kg   SpO2 98%   BMI 24.21 kg/m   Physical Exam CONSTITUTIONAL: Well developed/well nourished, no distress HEAD: Normocephalic/atraumatic EYES: EOMI/PERRL, no icterus ENMT: Mucous membranes moist NECK: supple no meningeal signs SPINE/BACK:entire spine nontender CV: S1/S2 noted, no murmurs/rubs/gallops noted LUNGS: Lungs are clear to auscultation bilaterally, no apparent distress ABDOMEN: soft, mild right upper quadrant and epigastric tenderness, no rebound or guarding, bowel sounds noted throughout abdomen GU:no cva tenderness NEURO: Pt is awake/alert/appropriate, moves all extremitiesx4.  No facial droop.  EXTREMITIES: pulses normal/equal, full ROM SKIN: warm, color normal PSYCH: no abnormalities of mood noted, alert and oriented to situation  ED Results / Procedures / Treatments   Labs (all labs ordered are listed, but only abnormal results are displayed) Labs Reviewed  COMPREHENSIVE METABOLIC PANEL - Abnormal; Notable for the following components:      Result Value   Potassium 3.4 (*)    Glucose, Bld 113 (*)    All other components within normal limits  CBC - Abnormal; Notable for the following components:   WBC 10.8 (*)    Platelets 417 (*)    All other components within normal limits  URINALYSIS, ROUTINE W REFLEX MICROSCOPIC - Abnormal; Notable for the following components:   Color, Urine COLORLESS (*)    Specific Gravity, Urine 1.002 (*)    All other components within normal limits  LIPASE, BLOOD  I-STAT BETA HCG BLOOD, ED ([facility], [facility], [facility] ONLY)    EKG None  Radiology Abdomen Limited RUQ  Result Date: 12/26/2019 CLINICAL DATA:  Right upper quadrant pain for 2 weeks. EXAM: ULTRASOUND ABDOMEN LIMITED RIGHT UPPER QUADRANT COMPARISON:  None. FINDINGS: Gallbladder:  No gallstones or wall thickening visualized. No sonographic Murphy sign noted by sonographer. Common bile duct: Diameter: 3 mm Liver: No focal lesion identified. Within normal limits in parenchymal echogenicity. Portal vein is patent on color Doppler imaging with normal direction of blood flow towards the liver. Other: None. IMPRESSION: Normal right upper quadrant ultrasound. Electronically Signed   By: 02/25/2020 M.D.   On: 12/26/2019 06:52    Procedures Procedures   Medications Ordered in ED Medications  sodium chloride flush (NS) 0.9 % injection 3 mL (0 mLs Intravenous Hold 12/26/19 0550)  ondansetron (ZOFRAN-ODT) disintegrating tablet 4 mg (has no administration in time range)  ondansetron (ZOFRAN-ODT) disintegrating tablet 8 mg (8 mg Oral Given 12/26/19 0554)  famotidine (PEPCID) tablet 20 mg (20 mg Oral Given 12/26/19 0554)    ED Course  I have reviewed the triage vital signs and the nursing notes.  Pertinent labs & imaging results that were available during my care of the patient were reviewed by me and  considered in my medical decision making (see chart for details).    MDM Rules/Calculators/A&P                      Patient presented with 2 weeks of intermittent abdominal pain and vomiting.  She was told it was a GI bug.  She reports tonight she had some blood mixed in her vomitus, but this is after multiple episodes of vomiting.  Likely Mallory-Weiss tear, no signs of active GI bleed, labs reassuring  Patient overall is well-appearing.  She did have focal upper abdominal tenderness, right upper quadrant ultrasound performed and was negative.  Patient is in no distress. BP 118/71 (BP Location: Left Arm)   Pulse 88   Temp 98.2 F (36.8 C) (Oral)   Resp 16   Ht 1.676 m (5\' 6" )   Wt 68 kg   SpO2 99%   BMI 24.21 kg/m  She feels comfortable for discharge home.  Suspect could be gastritis, will start on Prilosec.  She referred to [facility] gastroenterology as her PCP is in [place] Patient is appropriate for discharge home I have low suspicion for acute abdominal emergency at this time Final Clinical Impression(s) / ED Diagnoses Final diagnoses:  Pain  Non-intractable vomiting with nausea, unspecified vomiting type  Right upper quadrant abdominal pain    Rx / DC Orders ED Discharge Orders         Ordered    omeprazole (PRILOSEC) 40 MG capsule  Daily     12/26/19 0700           Ripley Fraise, MD 12/26/19 [phone]

## 2020-01-17 DIAGNOSIS — F332 Major depressive disorder, recurrent severe without psychotic features: Secondary | ICD-10-CM | POA: Diagnosis not present

## 2020-01-20 DIAGNOSIS — F321 Major depressive disorder, single episode, moderate: Secondary | ICD-10-CM | POA: Diagnosis not present

## 2020-02-03 DIAGNOSIS — F321 Major depressive disorder, single episode, moderate: Secondary | ICD-10-CM | POA: Diagnosis not present

## 2020-02-14 DIAGNOSIS — F332 Major depressive disorder, recurrent severe without psychotic features: Secondary | ICD-10-CM | POA: Diagnosis not present

## 2020-02-17 DIAGNOSIS — F321 Major depressive disorder, single episode, moderate: Secondary | ICD-10-CM | POA: Diagnosis not present

## 2020-02-24 DIAGNOSIS — F321 Major depressive disorder, single episode, moderate: Secondary | ICD-10-CM | POA: Diagnosis not present

## 2020-03-02 DIAGNOSIS — F321 Major depressive disorder, single episode, moderate: Secondary | ICD-10-CM | POA: Diagnosis not present

## 2020-03-10 DIAGNOSIS — Z01419 Encounter for gynecological examination (general) (routine) without abnormal findings: Secondary | ICD-10-CM | POA: Diagnosis not present

## 2020-03-10 DIAGNOSIS — Z113 Encounter for screening for infections with a predominantly sexual mode of transmission: Secondary | ICD-10-CM | POA: Diagnosis not present

## 2020-03-20 DIAGNOSIS — F321 Major depressive disorder, single episode, moderate: Secondary | ICD-10-CM | POA: Diagnosis not present

## 2020-03-25 DIAGNOSIS — S90851A Superficial foreign body, right foot, initial encounter: Secondary | ICD-10-CM | POA: Diagnosis not present

## 2020-04-10 DIAGNOSIS — F909 Attention-deficit hyperactivity disorder, unspecified type: Secondary | ICD-10-CM | POA: Diagnosis not present

## 2020-05-09 DIAGNOSIS — F909 Attention-deficit hyperactivity disorder, unspecified type: Secondary | ICD-10-CM | POA: Diagnosis not present

## 2020-05-17 DIAGNOSIS — S92911A Unspecified fracture of right toe(s), initial encounter for closed fracture: Secondary | ICD-10-CM | POA: Diagnosis not present

## 2020-05-17 DIAGNOSIS — S93504A Unspecified sprain of right lesser toe(s), initial encounter: Secondary | ICD-10-CM | POA: Diagnosis not present
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
## 2020-05-24 DIAGNOSIS — R110 Nausea: Secondary | ICD-10-CM | POA: Diagnosis not present

## 2020-05-24 DIAGNOSIS — R1013 Epigastric pain: Secondary | ICD-10-CM | POA: Diagnosis not present

## 2020-05-24 DIAGNOSIS — R635 Abnormal weight gain: Secondary | ICD-10-CM | POA: Diagnosis not present

## 2020-05-26 DIAGNOSIS — S92511A Displaced fracture of proximal phalanx of right lesser toe(s), initial encounter for closed fracture: Secondary | ICD-10-CM | POA: Diagnosis not present

## 2020-05-26 DIAGNOSIS — Z713 Dietary counseling and surveillance: Secondary | ICD-10-CM | POA: Diagnosis not present

## 2020-06-19 DIAGNOSIS — Z713 Dietary counseling and surveillance: Secondary | ICD-10-CM | POA: Diagnosis not present

## 2020-07-04 DIAGNOSIS — F332 Major depressive disorder, recurrent severe without psychotic features: Secondary | ICD-10-CM | POA: Diagnosis not present

## 2020-07-28 DIAGNOSIS — Z713 Dietary counseling and surveillance: Secondary | ICD-10-CM | POA: Diagnosis not present

## 2020-07-28 DIAGNOSIS — F321 Major depressive disorder, single episode, moderate: Secondary | ICD-10-CM | POA: Diagnosis not present

## 2020-08-11 ENCOUNTER — Other Ambulatory Visit (HOSPITAL_COMMUNITY): Payer: Self-pay | Admitting: Psychiatry

## 2020-08-11 DIAGNOSIS — F411 Generalized anxiety disorder: Secondary | ICD-10-CM

## 2020-08-11 DIAGNOSIS — F321 Major depressive disorder, single episode, moderate: Secondary | ICD-10-CM | POA: Diagnosis not present

## 2020-08-16 DIAGNOSIS — Z713 Dietary counseling and surveillance: Secondary | ICD-10-CM | POA: Diagnosis not present

## 2020-08-21 ENCOUNTER — Other Ambulatory Visit: Payer: BC Managed Care – PPO

## 2020-08-21 DIAGNOSIS — Z20822 Contact with and (suspected) exposure to covid-19: Secondary | ICD-10-CM | POA: Diagnosis not present

## 2020-08-22 LAB — NOVEL CORONAVIRUS, NAA: SARS-CoV-2, NAA: NOT DETECTED

## 2020-08-22 LAB — SARS-COV-2, NAA 2 DAY TAT

## 2020-08-29 DIAGNOSIS — F909 Attention-deficit hyperactivity disorder, unspecified type: Secondary | ICD-10-CM | POA: Diagnosis not present

## 2020-10-02 DIAGNOSIS — F321 Major depressive disorder, single episode, moderate: Secondary | ICD-10-CM | POA: Diagnosis not present

## 2020-10-23 DIAGNOSIS — F321 Major depressive disorder, single episode, moderate: Secondary | ICD-10-CM | POA: Diagnosis not present

## 2020-10-24 DIAGNOSIS — F909 Attention-deficit hyperactivity disorder, unspecified type: Secondary | ICD-10-CM | POA: Diagnosis not present

## 2020-11-01 DIAGNOSIS — Z202 Contact with and (suspected) exposure to infections with a predominantly sexual mode of transmission: Secondary | ICD-10-CM | POA: Diagnosis not present

## 2020-11-01 DIAGNOSIS — R3989 Other symptoms and signs involving the genitourinary system: Secondary | ICD-10-CM | POA: Diagnosis not present

## 2020-11-07 DIAGNOSIS — R300 Dysuria: Secondary | ICD-10-CM | POA: Diagnosis not present

## 2020-11-14 DIAGNOSIS — F321 Major depressive disorder, single episode, moderate: Secondary | ICD-10-CM | POA: Diagnosis not present

## 2020-11-27 DIAGNOSIS — F321 Major depressive disorder, single episode, moderate: Secondary | ICD-10-CM | POA: Diagnosis not present

## 2020-12-13 DIAGNOSIS — D1739 Benign lipomatous neoplasm of skin and subcutaneous tissue of other sites: Secondary | ICD-10-CM | POA: Diagnosis not present

## 2020-12-19 DIAGNOSIS — F332 Major depressive disorder, recurrent severe without psychotic features: Secondary | ICD-10-CM | POA: Diagnosis not present

## 2021-01-16 DIAGNOSIS — F909 Attention-deficit hyperactivity disorder, unspecified type: Secondary | ICD-10-CM | POA: Diagnosis not present

## 2021-02-05 DIAGNOSIS — F321 Major depressive disorder, single episode, moderate: Secondary | ICD-10-CM | POA: Diagnosis not present

## 2021-02-13 DIAGNOSIS — F332 Major depressive disorder, recurrent severe without psychotic features: Secondary | ICD-10-CM | POA: Diagnosis not present

## 2021-02-19 DIAGNOSIS — F321 Major depressive disorder, single episode, moderate: Secondary | ICD-10-CM | POA: Diagnosis not present

## 2021-03-13 DIAGNOSIS — Z01419 Encounter for gynecological examination (general) (routine) without abnormal findings: Secondary | ICD-10-CM | POA: Diagnosis not present

## 2021-03-13 DIAGNOSIS — F909 Attention-deficit hyperactivity disorder, unspecified type: Secondary | ICD-10-CM | POA: Diagnosis not present

## 2021-03-13 DIAGNOSIS — Z113 Encounter for screening for infections with a predominantly sexual mode of transmission: Secondary | ICD-10-CM | POA: Diagnosis not present

## 2021-03-13 DIAGNOSIS — Z3202 Encounter for pregnancy test, result negative: Secondary | ICD-10-CM | POA: Diagnosis not present

## 2021-03-13 DIAGNOSIS — Z124 Encounter for screening for malignant neoplasm of cervix: Secondary | ICD-10-CM | POA: Diagnosis not present

## 2021-03-19 DIAGNOSIS — F321 Major depressive disorder, single episode, moderate: Secondary | ICD-10-CM | POA: Diagnosis not present

## 2021-03-27 DIAGNOSIS — M9903 Segmental and somatic dysfunction of lumbar region: Secondary | ICD-10-CM | POA: Diagnosis not present

## 2021-03-27 DIAGNOSIS — M62838 Other muscle spasm: Secondary | ICD-10-CM | POA: Diagnosis not present

## 2021-03-27 DIAGNOSIS — M9902 Segmental and somatic dysfunction of thoracic region: Secondary | ICD-10-CM | POA: Diagnosis not present

## 2021-03-27 DIAGNOSIS — M9901 Segmental and somatic dysfunction of cervical region: Secondary | ICD-10-CM | POA: Diagnosis not present

## 2021-03-27 DIAGNOSIS — M9905 Segmental and somatic dysfunction of pelvic region: Secondary | ICD-10-CM | POA: Diagnosis not present

## 2021-04-02 DIAGNOSIS — M62838 Other muscle spasm: Secondary | ICD-10-CM | POA: Diagnosis not present

## 2021-04-02 DIAGNOSIS — F321 Major depressive disorder, single episode, moderate: Secondary | ICD-10-CM | POA: Diagnosis not present

## 2021-04-02 DIAGNOSIS — M9901 Segmental and somatic dysfunction of cervical region: Secondary | ICD-10-CM | POA: Diagnosis not present

## 2021-04-02 DIAGNOSIS — M9903 Segmental and somatic dysfunction of lumbar region: Secondary | ICD-10-CM | POA: Diagnosis not present

## 2021-04-02 DIAGNOSIS — M9905 Segmental and somatic dysfunction of pelvic region: Secondary | ICD-10-CM | POA: Diagnosis not present

## 2021-04-02 DIAGNOSIS — M9902 Segmental and somatic dysfunction of thoracic region: Secondary | ICD-10-CM | POA: Diagnosis not present

## 2021-04-10 DIAGNOSIS — M9902 Segmental and somatic dysfunction of thoracic region: Secondary | ICD-10-CM | POA: Diagnosis not present

## 2021-04-10 DIAGNOSIS — M9901 Segmental and somatic dysfunction of cervical region: Secondary | ICD-10-CM | POA: Diagnosis not present

## 2021-04-10 DIAGNOSIS — M9903 Segmental and somatic dysfunction of lumbar region: Secondary | ICD-10-CM | POA: Diagnosis not present

## 2021-04-10 DIAGNOSIS — M62838 Other muscle spasm: Secondary | ICD-10-CM | POA: Diagnosis not present

## 2021-04-10 DIAGNOSIS — M9905 Segmental and somatic dysfunction of pelvic region: Secondary | ICD-10-CM | POA: Diagnosis not present

## 2021-04-16 DIAGNOSIS — F321 Major depressive disorder, single episode, moderate: Secondary | ICD-10-CM | POA: Diagnosis not present

## 2021-04-16 DIAGNOSIS — M9903 Segmental and somatic dysfunction of lumbar region: Secondary | ICD-10-CM | POA: Diagnosis not present

## 2021-04-16 DIAGNOSIS — M9902 Segmental and somatic dysfunction of thoracic region: Secondary | ICD-10-CM | POA: Diagnosis not present

## 2021-04-16 DIAGNOSIS — M9901 Segmental and somatic dysfunction of cervical region: Secondary | ICD-10-CM | POA: Diagnosis not present

## 2021-04-16 DIAGNOSIS — M7541 Impingement syndrome of right shoulder: Secondary | ICD-10-CM | POA: Diagnosis not present

## 2021-04-16 DIAGNOSIS — M9905 Segmental and somatic dysfunction of pelvic region: Secondary | ICD-10-CM | POA: Diagnosis not present

## 2021-04-23 DIAGNOSIS — M9903 Segmental and somatic dysfunction of lumbar region: Secondary | ICD-10-CM | POA: Diagnosis not present

## 2021-04-23 DIAGNOSIS — M9905 Segmental and somatic dysfunction of pelvic region: Secondary | ICD-10-CM | POA: Diagnosis not present

## 2021-04-23 DIAGNOSIS — M9901 Segmental and somatic dysfunction of cervical region: Secondary | ICD-10-CM | POA: Diagnosis not present

## 2021-04-23 DIAGNOSIS — M9902 Segmental and somatic dysfunction of thoracic region: Secondary | ICD-10-CM | POA: Diagnosis not present

## 2021-04-23 DIAGNOSIS — M7541 Impingement syndrome of right shoulder: Secondary | ICD-10-CM | POA: Diagnosis not present

## 2021-05-03 DIAGNOSIS — M9901 Segmental and somatic dysfunction of cervical region: Secondary | ICD-10-CM | POA: Diagnosis not present

## 2021-05-03 DIAGNOSIS — M9902 Segmental and somatic dysfunction of thoracic region: Secondary | ICD-10-CM | POA: Diagnosis not present

## 2021-05-03 DIAGNOSIS — M9905 Segmental and somatic dysfunction of pelvic region: Secondary | ICD-10-CM | POA: Diagnosis not present

## 2021-05-03 DIAGNOSIS — M9903 Segmental and somatic dysfunction of lumbar region: Secondary | ICD-10-CM | POA: Diagnosis not present

## 2021-05-03 DIAGNOSIS — M7541 Impingement syndrome of right shoulder: Secondary | ICD-10-CM | POA: Diagnosis not present

## 2021-05-07 DIAGNOSIS — F321 Major depressive disorder, single episode, moderate: Secondary | ICD-10-CM | POA: Diagnosis not present

## 2021-05-07 DIAGNOSIS — F909 Attention-deficit hyperactivity disorder, unspecified type: Secondary | ICD-10-CM | POA: Diagnosis not present

## 2021-05-10 DIAGNOSIS — M9905 Segmental and somatic dysfunction of pelvic region: Secondary | ICD-10-CM | POA: Diagnosis not present

## 2021-05-10 DIAGNOSIS — M7541 Impingement syndrome of right shoulder: Secondary | ICD-10-CM | POA: Diagnosis not present

## 2021-05-10 DIAGNOSIS — M9903 Segmental and somatic dysfunction of lumbar region: Secondary | ICD-10-CM | POA: Diagnosis not present

## 2021-05-10 DIAGNOSIS — M9901 Segmental and somatic dysfunction of cervical region: Secondary | ICD-10-CM | POA: Diagnosis not present

## 2021-05-10 DIAGNOSIS — M9902 Segmental and somatic dysfunction of thoracic region: Secondary | ICD-10-CM | POA: Diagnosis not present

## 2021-05-17 DIAGNOSIS — M9902 Segmental and somatic dysfunction of thoracic region: Secondary | ICD-10-CM | POA: Diagnosis not present

## 2021-05-17 DIAGNOSIS — M9903 Segmental and somatic dysfunction of lumbar region: Secondary | ICD-10-CM | POA: Diagnosis not present

## 2021-05-17 DIAGNOSIS — M9901 Segmental and somatic dysfunction of cervical region: Secondary | ICD-10-CM | POA: Diagnosis not present

## 2021-05-17 DIAGNOSIS — M7541 Impingement syndrome of right shoulder: Secondary | ICD-10-CM | POA: Diagnosis not present

## 2021-05-17 DIAGNOSIS — M9905 Segmental and somatic dysfunction of pelvic region: Secondary | ICD-10-CM | POA: Diagnosis not present

## 2021-05-22 DIAGNOSIS — M9903 Segmental and somatic dysfunction of lumbar region: Secondary | ICD-10-CM | POA: Diagnosis not present

## 2021-05-22 DIAGNOSIS — M9901 Segmental and somatic dysfunction of cervical region: Secondary | ICD-10-CM | POA: Diagnosis not present

## 2021-05-22 DIAGNOSIS — M9902 Segmental and somatic dysfunction of thoracic region: Secondary | ICD-10-CM | POA: Diagnosis not present

## 2021-05-22 DIAGNOSIS — M7541 Impingement syndrome of right shoulder: Secondary | ICD-10-CM | POA: Diagnosis not present

## 2021-05-22 DIAGNOSIS — M9905 Segmental and somatic dysfunction of pelvic region: Secondary | ICD-10-CM | POA: Diagnosis not present

## 2021-05-24 DIAGNOSIS — F321 Major depressive disorder, single episode, moderate: Secondary | ICD-10-CM | POA: Diagnosis not present

## 2021-05-29 DIAGNOSIS — M9903 Segmental and somatic dysfunction of lumbar region: Secondary | ICD-10-CM | POA: Diagnosis not present

## 2021-05-29 DIAGNOSIS — M7541 Impingement syndrome of right shoulder: Secondary | ICD-10-CM | POA: Diagnosis not present

## 2021-05-29 DIAGNOSIS — M9905 Segmental and somatic dysfunction of pelvic region: Secondary | ICD-10-CM | POA: Diagnosis not present

## 2021-05-29 DIAGNOSIS — M9901 Segmental and somatic dysfunction of cervical region: Secondary | ICD-10-CM | POA: Diagnosis not present

## 2021-05-29 DIAGNOSIS — M9902 Segmental and somatic dysfunction of thoracic region: Secondary | ICD-10-CM | POA: Diagnosis not present

## 2021-06-11 DIAGNOSIS — M7541 Impingement syndrome of right shoulder: Secondary | ICD-10-CM | POA: Diagnosis not present

## 2021-06-11 DIAGNOSIS — M9905 Segmental and somatic dysfunction of pelvic region: Secondary | ICD-10-CM | POA: Diagnosis not present

## 2021-06-11 DIAGNOSIS — M9903 Segmental and somatic dysfunction of lumbar region: Secondary | ICD-10-CM | POA: Diagnosis not present

## 2021-06-11 DIAGNOSIS — M9901 Segmental and somatic dysfunction of cervical region: Secondary | ICD-10-CM | POA: Diagnosis not present

## 2021-06-11 DIAGNOSIS — M9902 Segmental and somatic dysfunction of thoracic region: Secondary | ICD-10-CM | POA: Diagnosis not present

## 2021-06-20 DIAGNOSIS — M9901 Segmental and somatic dysfunction of cervical region: Secondary | ICD-10-CM | POA: Diagnosis not present

## 2021-06-20 DIAGNOSIS — M9907 Segmental and somatic dysfunction of upper extremity: Secondary | ICD-10-CM | POA: Diagnosis not present

## 2021-06-20 DIAGNOSIS — M9902 Segmental and somatic dysfunction of thoracic region: Secondary | ICD-10-CM | POA: Diagnosis not present

## 2021-06-20 DIAGNOSIS — M9903 Segmental and somatic dysfunction of lumbar region: Secondary | ICD-10-CM | POA: Diagnosis not present

## 2021-06-20 DIAGNOSIS — M9905 Segmental and somatic dysfunction of pelvic region: Secondary | ICD-10-CM | POA: Diagnosis not present

## 2021-06-20 DIAGNOSIS — M7541 Impingement syndrome of right shoulder: Secondary | ICD-10-CM | POA: Diagnosis not present

## 2021-06-20 DIAGNOSIS — M9906 Segmental and somatic dysfunction of lower extremity: Secondary | ICD-10-CM | POA: Diagnosis not present

## 2021-06-25 IMAGING — US US ABDOMEN LIMITED
1 series · 14 of 25 positions shown · non-contrast
Comparison: None.

CLINICAL DATA: Right upper quadrant pain for 2 weeks.

EXAM:
ULTRASOUND ABDOMEN LIMITED RIGHT UPPER QUADRANT

[Series 1: us abdomen limited · 14 of 39 slices shown]
[im 1/39]
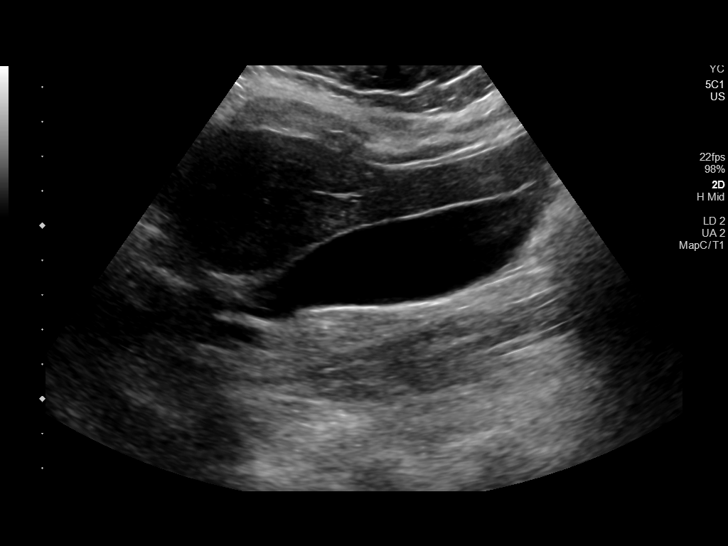
[im 4/39]
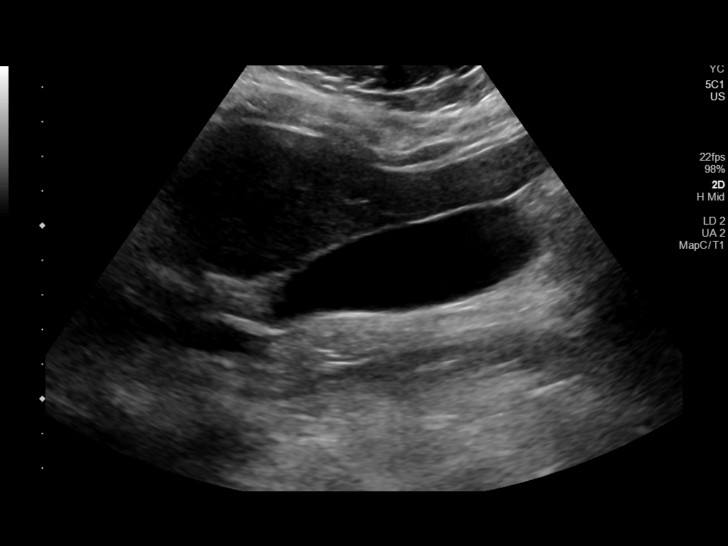
[im 7/39]
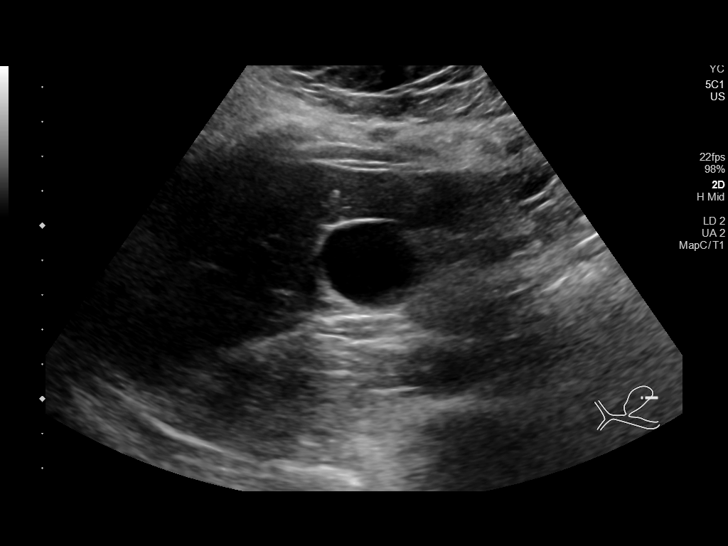
[im 10/39]
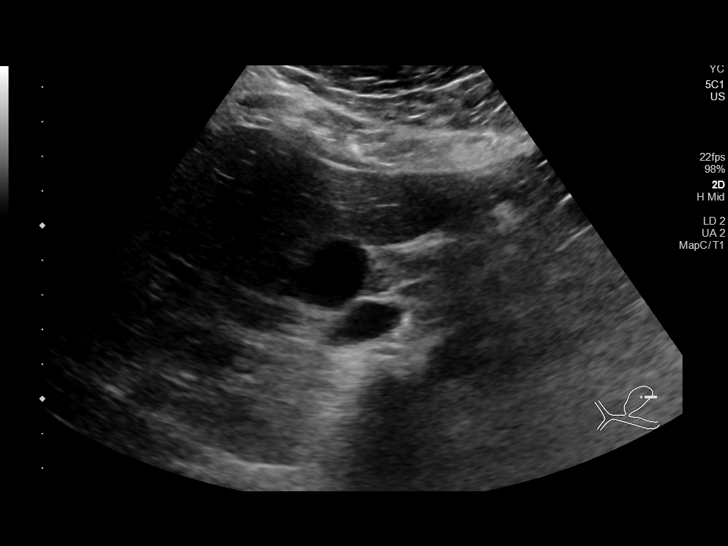
[im 13/39]
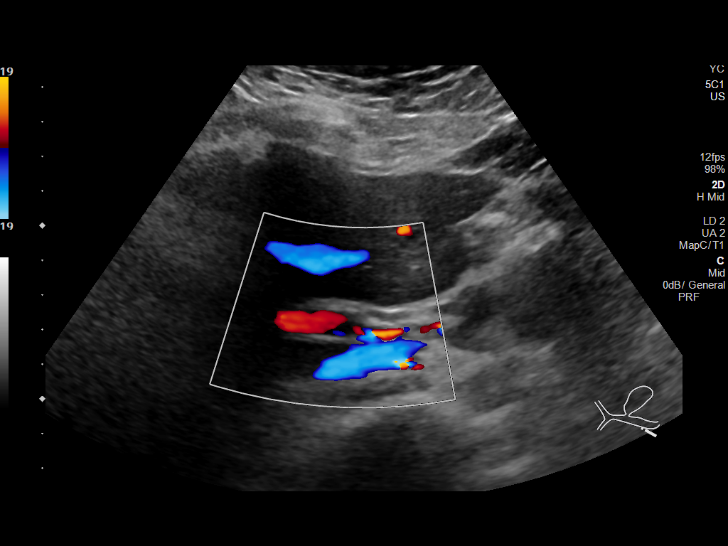
[im 15/39]
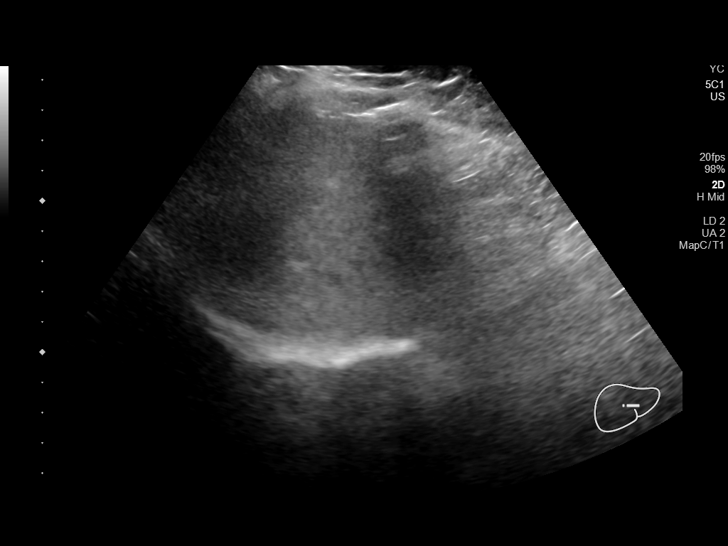
[im 18/39]
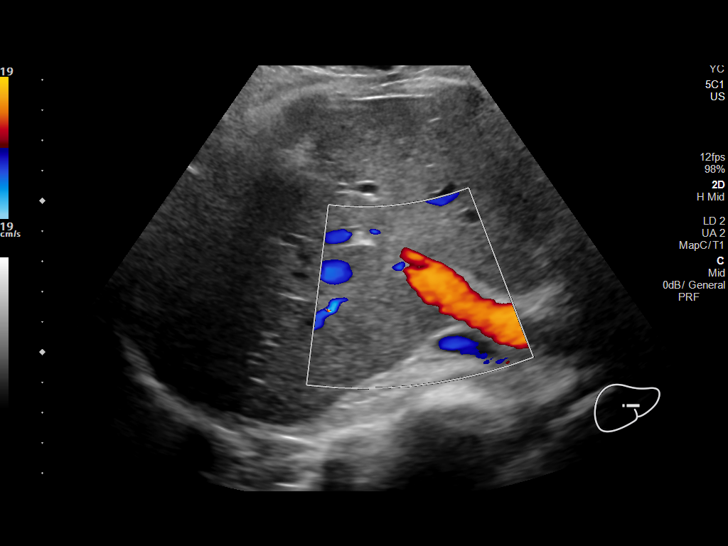
[im 21/39]
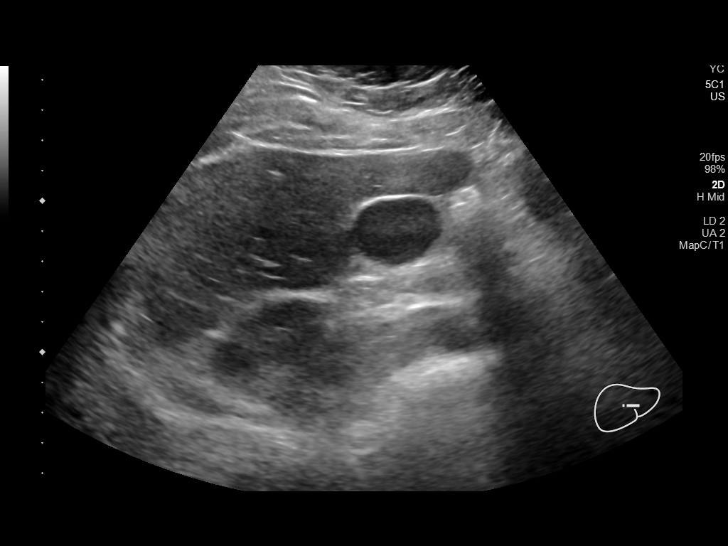
[im 24/39]
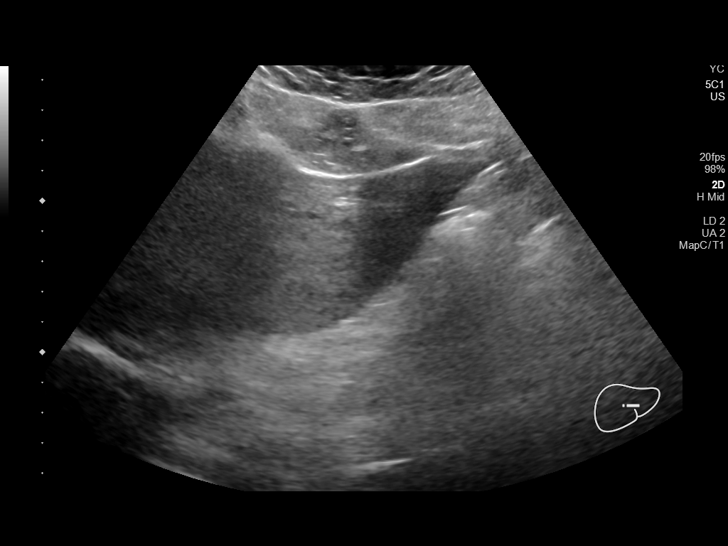
[im 26/39]
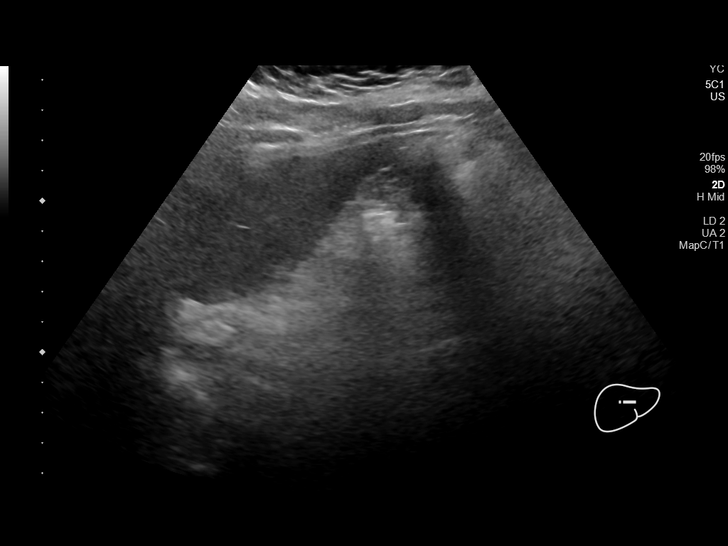
[im 29/39]
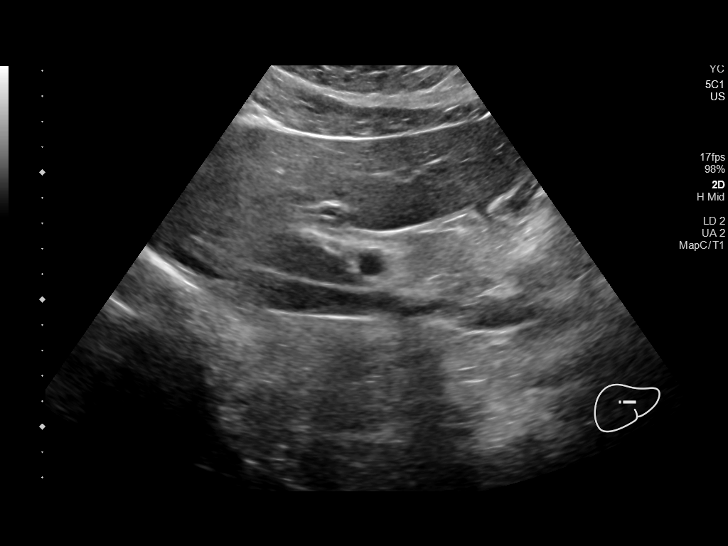
[im 32/39]
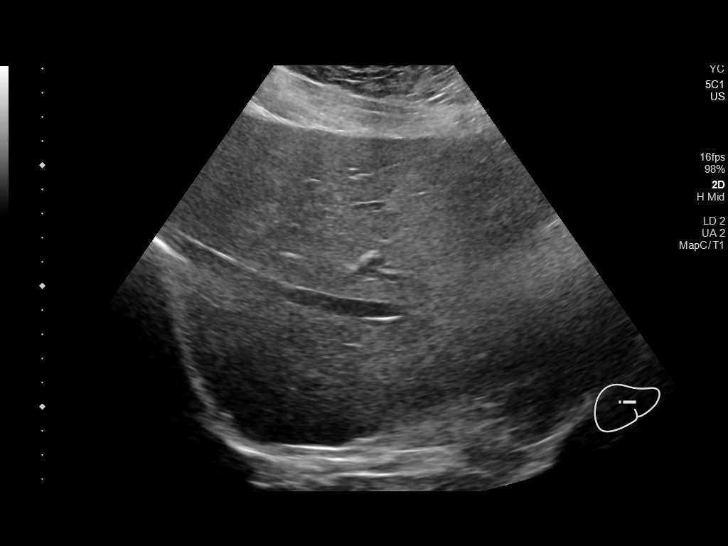
[im 35/39]
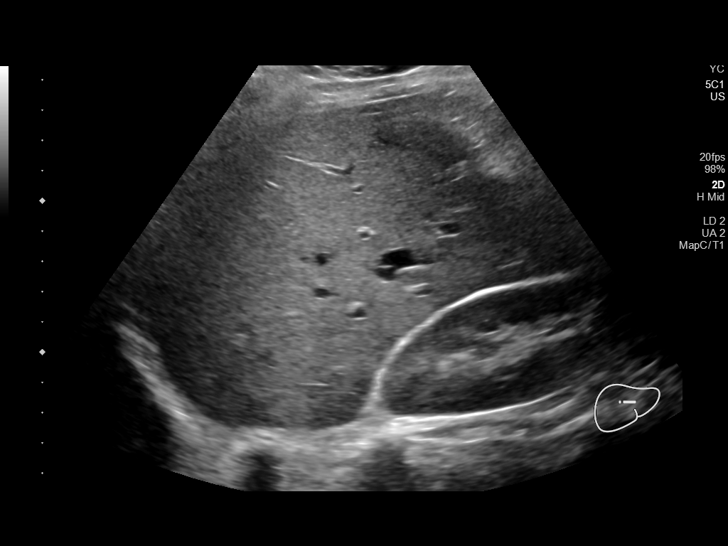
[im 39/39]
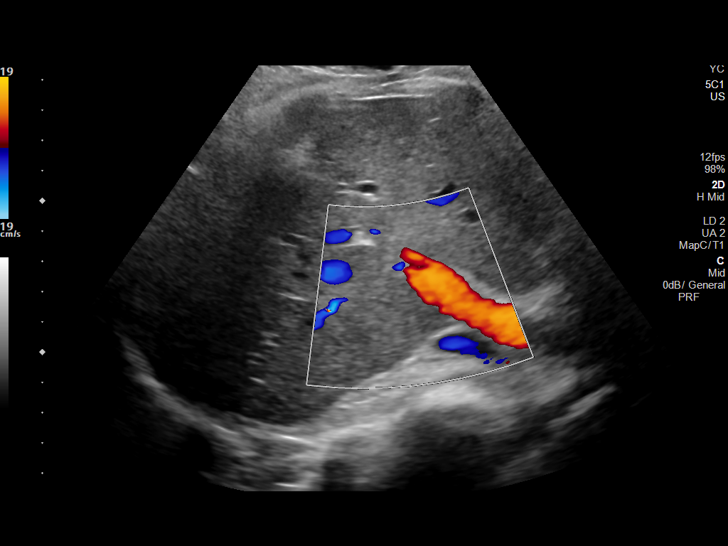

[14 of 25 positions shown; findings below may reference images not displayed]

FINDINGS: Gallbladder:

No gallstones or wall thickening visualized. No sonographic Murphy
sign noted by sonographer.

Common bile duct:

Diameter: 3 mm

Liver:

No focal lesion identified. Within normal limits in parenchymal
echogenicity. Portal vein is patent on color Doppler imaging with
normal direction of blood flow towards the liver.

Other: None.
IMPRESSION: Normal right upper quadrant ultrasound.

## 2021-06-26 DIAGNOSIS — F332 Major depressive disorder, recurrent severe without psychotic features: Secondary | ICD-10-CM | POA: Diagnosis not present

## 2021-12-13 ENCOUNTER — Encounter (HOSPITAL_COMMUNITY): Payer: Self-pay

## 2021-12-13 ENCOUNTER — Ambulatory Visit (HOSPITAL_COMMUNITY): Payer: No Payment, Other | Admitting: Licensed Clinical Social Worker

## 2021-12-13 ENCOUNTER — Telehealth (HOSPITAL_COMMUNITY): Payer: Self-pay | Admitting: Licensed Clinical Social Worker

## 2021-12-13 NOTE — Telephone Encounter (Signed)
LCSW sent 2 links to patient's phone at 1103 and at 1106 with no response.  LCSW followed up with a phone call at 1110 with no response.  LCSW left voicemail for patient to enter links or to call and reschedule at [phone].  LCSW waited until 1115 before disconnecting.  LCSW will mark patient as no-show for the session.

## 2023-05-23 ENCOUNTER — Other Ambulatory Visit: Payer: Self-pay

## 2023-05-23 ENCOUNTER — Encounter (HOSPITAL_COMMUNITY): Payer: Self-pay | Admitting: Emergency Medicine

## 2023-05-23 ENCOUNTER — Emergency Department (HOSPITAL_COMMUNITY)
Admission: EM | Admit: 2023-05-23 | Discharge: 2023-05-23 | Disposition: A | Discharge status: Home / Self Care | Payer: BLUE CROSS/BLUE SHIELD | Attending: Emergency Medicine | Admitting: Emergency Medicine

## 2023-05-23 ENCOUNTER — Emergency Department (HOSPITAL_COMMUNITY): Payer: BLUE CROSS/BLUE SHIELD

## 2023-05-23 DIAGNOSIS — R1032 Left lower quadrant pain: Secondary | ICD-10-CM | POA: Diagnosis present

## 2023-05-23 DIAGNOSIS — J45909 Unspecified asthma, uncomplicated: Secondary | ICD-10-CM | POA: Diagnosis not present

## 2023-05-23 LAB — URINALYSIS, ROUTINE W REFLEX MICROSCOPIC
Bilirubin Urine: NEGATIVE
Glucose, UA: NEGATIVE mg/dL
Ketones, ur: NEGATIVE mg/dL
Nitrite: NEGATIVE
Protein, ur: 30 mg/dL — AB
Specific Gravity, Urine: 1.018 (ref 1.005–1.030)
pH: 6 (ref 5.0–8.0)

## 2023-05-23 LAB — COMPREHENSIVE METABOLIC PANEL
ALT: 16 U/L (ref 0–44)
AST: 14 U/L — ABNORMAL LOW (ref 15–41)
Albumin: 3.8 g/dL (ref 3.5–5.0)
Alkaline Phosphatase: 66 U/L (ref 38–126)
Anion gap: 10 (ref 5–15)
BUN: 11 mg/dL (ref 6–20)
CO2: 22 mmol/L (ref 22–32)
Calcium: 9.2 mg/dL (ref 8.9–10.3)
Chloride: 105 mmol/L (ref 98–111)
Creatinine, Ser: 0.78 mg/dL (ref 0.44–1.00)
GFR, Estimated: >60 mL/min (ref >60)
Glucose, Bld: 108 mg/dL — ABNORMAL HIGH (ref 70–99)
Potassium: 3.7 mmol/L (ref 3.5–5.1)
Sodium: 137 mmol/L (ref 135–145)
Total Bilirubin: 0.7 mg/dL (ref 0.3–1.2)
Total Protein: 6.7 g/dL (ref 6.5–8.1)

## 2023-05-23 LAB — CBC WITH DIFFERENTIAL/PLATELET
Abs Immature Granulocytes: 0.02 10*3/uL (ref 0.00–0.07)
Basophils Absolute: 0.1 10*3/uL (ref 0.0–0.1)
Basophils Relative: 1 %
Eosinophils Absolute: 0.3 10*3/uL (ref 0.0–0.5)
Eosinophils Relative: 4 %
HCT: 42.1 % (ref 36.0–46.0)
Hemoglobin: 13.4 g/dL (ref 12.0–15.0)
Immature Granulocytes: 0 %
Lymphocytes Relative: 25 %
Lymphs Abs: 2.2 10*3/uL (ref 0.7–4.0)
MCH: 28.2 pg (ref 26.0–34.0)
MCHC: 31.8 g/dL (ref 30.0–36.0)
MCV: 88.4 fL (ref 80.0–100.0)
Monocytes Absolute: 0.5 10*3/uL (ref 0.1–1.0)
Monocytes Relative: 6 %
Neutro Abs: 5.6 10*3/uL (ref 1.7–7.7)
Neutrophils Relative %: 64 %
Platelets: 333 10*3/uL (ref 150–400)
RBC: 4.76 MIL/uL (ref 3.87–5.11)
RDW: 12.9 % (ref 11.5–15.5)
WBC: 8.7 10*3/uL (ref 4.0–10.5)
nRBC: 0 % (ref 0.0–0.2)

## 2023-05-23 LAB — LIPASE, BLOOD: Lipase: 25 U/L (ref 11–51)

## 2023-05-23 LAB — PREGNANCY, URINE: Preg Test, Ur: NEGATIVE

## 2023-05-23 MED ORDER — MORPHINE SULFATE (PF) 4 MG/ML IV SOLN
4.0000 mg | Freq: Once | INTRAVENOUS | Status: AC
  Administered 2023-05-23: 2 mg via INTRAVENOUS
  Filled 2023-05-23: qty 1

## 2023-05-23 MED ORDER — MORPHINE SULFATE (PF) 2 MG/ML IV SOLN
2.0000 mg | Freq: Once | INTRAVENOUS | Status: AC
  Administered 2023-05-23: 2 mg via INTRAVENOUS
  Filled 2023-05-23: qty 1

## 2023-05-23 MED ORDER — IOHEXOL 350 MG/ML SOLN
75.0000 mL | Freq: Once | INTRAVENOUS | Status: AC | PRN
  Administered 2023-05-23: 75 mL via INTRAVENOUS

## 2023-05-23 MED ORDER — IBUPROFEN 600 MG PO TABS: 600.0000 mg | ORAL_TABLET | Freq: Four times a day (QID) | ORAL | 0 refills | Status: AC | PRN

## 2023-05-23 NOTE — Discharge Instructions (Signed)
You have been evaluated for your symptoms.  Fortunately CT scan today did not show any concerning finding.  Your blood work are overall reassuring.  Please call and follow-up closely with your primary care provider or your OB/GYN for further managements of your health.

## 2023-05-23 NOTE — ED Triage Notes (Signed)
Pt here from home with c/o llq abd pain that has been ongoing and has got worse over the last 2 days

## 2023-05-23 NOTE — ED Provider Notes (Signed)
[facility] AT [facility] Provider Note   CSN: [id number] Arrival date & time: 05/23/23  1231     History  Chief Complaint  Patient presents with   Abdominal Pain    Paula Whitaker is a 27 y.o. female.  The history is provided by the patient and medical records. No language interpreter was used.  Abdominal Pain    27 year old female significant history of polysubstance abuse including cocaine abuse, depression, asthma, presenting with complaint of abdominal pain.  Patient report for the past 1 to 2 months she has had recurrent left lower quadrant abdominal pain.  She described pain as a sharp cramping bloating sensation usually localized to her left lower quadrant, waxing waning and presents most days.  Pain is sometimes intense causing her to feel short of breath.  She endorses occasional chills and occasional bouts of nausea and lightheadedness and dizziness when the pain is intense.  She is currently on day 3 of her menstruation and heavy vaginal bleeding.  She does not endorse any fever or chills no chest pain no productive cough no urinary discomfort no vaginal discharge or hematuria.  She mention she has been evaluated by her PCP for this as well as OB/GYN and has had STI screening 3 weeks ago was unremarkable.  She tries stretching at home and relax but her pain still present.  She denies any recent alcohol or tobacco use.  She denies any new sexual partners.  Home Medications Prior to Admission medications   Medication Sig Start Date End Date Taking? Authorizing Provider  albuterol (PROAIR HFA) 108 (90 BASE) MCG/ACT inhaler Inhale 2 puffs into the lungs every 6 (six) hours as needed for wheezing or shortness of breath. 07/12/14   Armandina Stammer I, NP  etonogestrel-ethinyl estradiol (NUVARING) 0.12-0.015 MG/24HR vaginal ring INSERT 1 RING LEAVE IN PLACE FOR 3 WEEKS THEN OUT FOR 1 WEEK 09/25/18   [provider]  gabapentin (NEURONTIN) 100 MG  capsule 100 mg daily. 08/17/18   [provider]  Multiple Vitamin (MULTIVITAMIN WITH MINERALS) TABS tablet Take 1 tablet by mouth daily. For low vitamin 07/12/14   Armandina Stammer I, NP  omeprazole (PRILOSEC) 40 MG capsule Take 1 capsule (40 mg total) by mouth daily. 12/26/19   Zadie Rhine, MD  ondansetron (ZOFRAN) 4 MG tablet Take 1 tablet (4 mg total) by mouth every 8 (eight) hours as needed for nausea or vomiting. 01/20/19   Lomax, Amy, NP  imipramine (TOFRANIL) 25 MG tablet TAKE 1 TABLET BY MOUTH EVERYDAY AT BEDTIME 12/23/18 12/26/19  Sater, Pearletha Furl, MD  rizatriptan (MAXALT) 10 MG tablet Take 1 tablet (10 mg total) by mouth as needed for migraine. May repeat in 2 hours if needed 01/20/19 12/26/19  Shawnie Dapper, NP      Allergies    Patient has no known allergies.    Review of Systems   Review of Systems  Gastrointestinal:  Positive for abdominal pain.  All other systems reviewed and are negative.   Physical Exam Updated Vital Signs BP (!) 150/97 (BP Location: Right Arm)   Pulse 97   Resp 20   LMP 05/23/2023 (Exact Date)   SpO2 100%  Physical Exam Vitals and nursing note reviewed.  Constitutional:      General: She is not in acute distress.    Appearance: She is well-developed.     Comments: Tearful  HENT:     Head: Atraumatic.  Eyes:     Conjunctiva/sclera: Conjunctivae normal.  Cardiovascular:     Rate and Rhythm: Normal rate and regular rhythm.  Pulmonary:     Effort: Pulmonary effort is normal.  Abdominal:     Palpations: Abdomen is soft.     Tenderness: There is abdominal tenderness in the left lower quadrant. There is no right CVA tenderness, left CVA tenderness, guarding or rebound. Negative signs include Murphy's sign and Rovsing's sign.     Hernia: No hernia is present.  Musculoskeletal:     Cervical back: Neck supple.  Skin:    Findings: No rash.  Neurological:     Mental Status: She is alert.  Psychiatric:        Mood and Affect: Mood normal.     ED  Results / Procedures / Treatments   Labs (all labs ordered are listed, but only abnormal results are displayed) Labs Reviewed  COMPREHENSIVE METABOLIC PANEL - Abnormal; Notable for the following components:      Result Value   Glucose, Bld 108 (*)    AST 14 (*)    All other components within normal limits  URINALYSIS, ROUTINE W REFLEX MICROSCOPIC - Abnormal; Notable for the following components:   APPearance HAZY (*)    Hgb urine dipstick LARGE (*)    Protein, ur 30 (*)    Leukocytes,Ua TRACE (*)    Bacteria, UA RARE (*)    All other components within normal limits  CBC WITH DIFFERENTIAL/PLATELET  LIPASE, BLOOD  PREGNANCY, URINE    EKG None  Radiology CT ABDOMEN PELVIS W CONTRAST  Result Date: 05/23/2023 CLINICAL DATA:  Left lower quadrant abdominal pain EXAM: CT ABDOMEN AND PELVIS WITH CONTRAST TECHNIQUE: Multidetector CT imaging of the abdomen and pelvis was performed using the standard protocol following bolus administration of intravenous contrast. RADIATION DOSE REDUCTION: This exam was performed according to the departmental dose-optimization program which includes automated exposure control, adjustment of the mA and/or kV according to patient size and/or use of iterative reconstruction technique. CONTRAST:  75mL OMNIPAQUE IOHEXOL 350 MG/ML SOLN COMPARISON:  None Available. FINDINGS: Lower chest: Included lung bases are clear.  Heart size is normal. Hepatobiliary: Liver measures 22 cm in length. No focal liver abnormality is seen. No gallstones, gallbladder wall thickening, or biliary dilatation. Pancreas: Unremarkable. No pancreatic ductal dilatation or surrounding inflammatory changes. Spleen: Normal in size without focal abnormality. Adrenals/Urinary Tract: Unremarkable adrenal glands. Kidneys enhance symmetrically without focal lesion, stone, or hydronephrosis. Ureters are nondilated. Urinary bladder is decompressed. Stomach/Bowel: Tiny hiatal hernia. Stomach is otherwise within  normal limits. Appendix appears normal (series 3, image 58). No evidence of bowel wall thickening, distention, or inflammatory changes. Vascular/Lymphatic: No significant vascular findings are present. No enlarged abdominal or pelvic lymph nodes. Reproductive: Uterus and bilateral adnexa are unremarkable. Other: No free fluid. No abdominopelvic fluid collection. No pneumoperitoneum. No abdominal wall hernia. Musculoskeletal: No acute or significant osseous findings. IMPRESSION: 1. No acute abdominopelvic findings. 2. Tiny hiatal hernia. 3. Hepatomegaly. Electronically Signed   By: Duanne Guess D.O.   On: 05/23/2023 18:10    Procedures Procedures    Medications Ordered in ED Medications  morphine (PF) 4 MG/ML injection 4 mg (2 mg Intravenous Given 05/23/23 1412)  iohexol (OMNIPAQUE) 350 MG/ML injection 75 mL (75 mLs Intravenous Contrast Given 05/23/23 1532)  morphine (PF) 2 MG/ML injection 2 mg (2 mg Intravenous Given 05/23/23 1615)    ED Course/ Medical Decision Making/ A&P  Medical Decision Making Amount and/or Complexity of Data Reviewed Labs: ordered. Radiology: ordered.  Risk Prescription drug management.   BP (!) 150/97 (BP Location: Right Arm)   Pulse 97   Temp 98.8 F (37.1 C) (Oral)   Resp 20   LMP 05/23/2023 (Exact Date)   SpO2 100%   44:77 PM  27 year old female significant history of polysubstance abuse including cocaine abuse, depression, asthma, presenting with complaint of abdominal pain.  Patient report for the past 1 to 2 months she has had recurrent left lower quadrant abdominal pain.  She described pain as a sharp cramping bloating sensation usually localized to her left lower quadrant, waxing waning and presents most days.  Pain is sometimes intense causing her to feel short of breath.  She endorses occasional chills and occasional bouts of nausea and lightheadedness and dizziness when the pain is intense.  She is currently on day 3  of her menstruation and heavy vaginal bleeding.  She does not endorse any fever or chills no chest pain no productive cough no urinary discomfort no vaginal discharge or hematuria.  She mention she has been evaluated by her PCP for this as well as OB/GYN and has had STI screening 3 weeks ago was unremarkable.  She tries stretching at home and relax but her pain still present.  She denies any recent alcohol or tobacco use.  She denies any new sexual partners.  Exam overall reassuring.  She does have some mild tenderness to left lower quadrant but no guarding or rebound tenderness no overlying skin changes no hernia noted.  Vitals are notable for elevated blood pressure of 150/97 likely secondary to pain.  Patient given morphine for pain control, workup initiated which will include abdominal pelvis CT scan for further assessment.  -Labs ordered, independently viewed and interpreted by me.  Labs remarkable for UA with large hgb, but this is from her current menstruation.  Preg test negative.  Normal WBC and electrolytes panel -The patient was maintained on a cardiac monitor.  I personally viewed and interpreted the cardiac monitored which showed an underlying rhythm of: NSR -Imaging independently viewed and interpreted by me and I agree with radiologist's interpretation.  Result remarkable for abd/pelvis CT without acute finding -This patient presents to the ED for concern of abd pain, this involves an extensive number of treatment options, and is a complaint that carries with it a high risk of complications and morbidity.  The differential diagnosis includes colitis, diverticulitis, ovarian cyst, constipation, MSK pain, shingles -Co morbidities that complicate the patient evaluation includes polysubstance use, depression -Treatment includes morphine -Reevaluation of the patient after these medicines showed that the patient improved -PCP office notes or outside notes reviewed -Escalation to  admission/observation considered: patients feels much better, is comfortable with discharge, and will follow up with PCP -Prescription medication considered, patient comfortable with ibuprofen -Social Determinant of Health considered          Final Clinical Impression(s) / ED Diagnoses Final diagnoses:  LLQ pain    Rx / DC Orders ED Discharge Orders          Ordered    ibuprofen (ADVIL) 600 MG tablet  Every 6 hours PRN        05/23/23 1825              Fayrene Helper, PA-C 05/23/23 1826    Laurence Spates, MD 05/26/23 [phone]

## 2023-05-23 NOTE — ED Notes (Addendum)
Patient ambulated to the restroom independently
